# Patient Record
Sex: Male | Born: 1955 | Race: Black or African American | Hispanic: No | Smoking: Former smoker
Health system: Southern US, Community
[De-identification: ages and names within clinical notes are randomized; demographics above are authoritative.]

## PROBLEM LIST (undated history)

## (undated) DIAGNOSIS — I10 Essential (primary) hypertension: Secondary | ICD-10-CM

## (undated) DIAGNOSIS — C801 Malignant (primary) neoplasm, unspecified: Secondary | ICD-10-CM

## (undated) DIAGNOSIS — I480 Paroxysmal atrial fibrillation: Secondary | ICD-10-CM

## (undated) DIAGNOSIS — E119 Type 2 diabetes mellitus without complications: Secondary | ICD-10-CM

## (undated) DIAGNOSIS — E782 Mixed hyperlipidemia: Secondary | ICD-10-CM

## (undated) DIAGNOSIS — D649 Anemia, unspecified: Secondary | ICD-10-CM

## (undated) HISTORY — DX: Anemia, unspecified: D64.9

## (undated) HISTORY — PX: SHOULDER ARTHROSCOPY: SHX128

## (undated) HISTORY — DX: Paroxysmal atrial fibrillation: I48.0

## (undated) HISTORY — DX: Essential (primary) hypertension: I10

## (undated) HISTORY — DX: Malignant (primary) neoplasm, unspecified: C80.1

## (undated) HISTORY — PX: BACK SURGERY: SHX140

## (undated) HISTORY — DX: Mixed hyperlipidemia: E78.2

## (undated) HISTORY — DX: Type 2 diabetes mellitus without complications: E11.9

---

## 1998-08-21 ENCOUNTER — Encounter: Payer: Self-pay | Admitting: Neurosurgery

## 1998-08-21 ENCOUNTER — Inpatient Hospital Stay (HOSPITAL_COMMUNITY): Admission: RE | Admit: 1998-08-21 | Discharge: 1998-08-22 | Payer: Self-pay | Admitting: Neurosurgery

## 2000-01-28 ENCOUNTER — Ambulatory Visit (HOSPITAL_COMMUNITY): Admission: RE | Admit: 2000-01-28 | Discharge: 2000-01-29 | Payer: Self-pay | Admitting: Neurosurgery

## 2000-01-28 ENCOUNTER — Encounter: Payer: Self-pay | Admitting: Neurosurgery

## 2004-02-27 ENCOUNTER — Ambulatory Visit (HOSPITAL_COMMUNITY): Admission: RE | Admit: 2004-02-27 | Discharge: 2004-02-27 | Payer: Self-pay | Admitting: Orthopedic Surgery

## 2004-05-12 ENCOUNTER — Ambulatory Visit: Payer: Self-pay | Admitting: Orthopedic Surgery

## 2006-08-14 ENCOUNTER — Ambulatory Visit (HOSPITAL_COMMUNITY): Admission: RE | Admit: 2006-08-14 | Discharge: 2006-08-14 | Payer: Self-pay | Admitting: Cardiovascular Disease

## 2009-11-20 HISTORY — PX: COLONOSCOPY: SHX174

## 2014-10-21 ENCOUNTER — Encounter (INDEPENDENT_AMBULATORY_CARE_PROVIDER_SITE_OTHER): Payer: Self-pay | Admitting: *Deleted

## 2014-10-21 ENCOUNTER — Encounter (INDEPENDENT_AMBULATORY_CARE_PROVIDER_SITE_OTHER): Payer: Self-pay

## 2014-11-26 HISTORY — PX: COLONOSCOPY: SHX174

## 2015-06-07 HISTORY — PX: COLONOSCOPY: SHX174

## 2016-05-10 ENCOUNTER — Encounter: Payer: Self-pay | Admitting: Orthopedic Surgery

## 2016-05-10 ENCOUNTER — Ambulatory Visit (INDEPENDENT_AMBULATORY_CARE_PROVIDER_SITE_OTHER): Payer: BLUE CROSS/BLUE SHIELD | Admitting: Orthopedic Surgery

## 2016-05-10 ENCOUNTER — Ambulatory Visit (INDEPENDENT_AMBULATORY_CARE_PROVIDER_SITE_OTHER): Payer: BLUE CROSS/BLUE SHIELD

## 2016-05-10 VITALS — BP 123/70 | HR 65 | Wt 177.0 lb

## 2016-05-10 DIAGNOSIS — M75102 Unspecified rotator cuff tear or rupture of left shoulder, not specified as traumatic: Secondary | ICD-10-CM

## 2016-05-10 DIAGNOSIS — G8929 Other chronic pain: Secondary | ICD-10-CM

## 2016-05-10 DIAGNOSIS — M25512 Pain in left shoulder: Secondary | ICD-10-CM

## 2016-05-10 NOTE — Progress Notes (Signed)
Patient ID: Pedro Gibbs, male   DOB: 1955-10-01, 60 y.o.   MRN: [id number]  Chief Complaint  Patient presents with  . Shoulder Pain    LEFT SHOULDER PAIN    HPI Pedro Gibbs is a 60 y.o. male.  Presents with pain in his left shoulder. Pain over the lateral deltoid pain with internal rotation and elevation  Pain now for several weeks no improvement with over-the-counter measures  Some mild dull ache tremor at rest and started when the weather turned  Review of Systems Review of Systems  Past Medical History:  Diagnosis Date  . Cancer ([place])   . Diabetes mellitus without complication ([place])   . Hypertension     Past Surgical History:  Procedure Laterality Date  . SHOULDER ARTHROSCOPY Right    Dr Lemmie Evens     Social History Social History  Substance Use Topics  . Smoking status: Never Smoker  . Smokeless tobacco: Never Used  . Alcohol use Not on file    Allergies  Allergen Reactions  . Asa [Aspirin] Nausea Only    Current Outpatient Prescriptions  Medication Sig Dispense Refill  . fenofibrate 160 MG tablet Take 160 mg by mouth daily.    Marland Kitchen HYDROcodone-acetaminophen (NORCO/VICODIN) 5-325 MG tablet Take 1 tablet by mouth every 6 (six) hours as needed for moderate pain.    Marland Kitchen lisinopril (PRINIVIL,ZESTRIL) 10 MG tablet Take 10 mg by mouth daily.     No current facility-administered medications for this visit.        Physical Exam BP 123/70   Pulse 65   Wt 177 lb (80.3 kg)  Physical Exam  Constitutional: He is oriented to person, place, and time. He appears well-developed and well-nourished. No distress.  Cardiovascular: Normal rate and intact distal pulses.   Neurological: He is alert and oriented to person, place, and time.  Skin: Skin is warm and dry. No rash noted. He is not diaphoretic. No erythema. No pallor.  Psychiatric: He has a normal mood and affect. His behavior is normal. Judgment and thought content normal.   Ambulatory status normal with no assistive  devices Right Shoulder Exam  Right shoulder exam is normal.  Tenderness  The patient is experiencing no tenderness.    Range of Motion  The patient has normal right shoulder ROM. Internal Rotation 0 degrees: normal   Muscle Strength  The patient has normal right shoulder strength.  Tests  Apprehension: negative Impingement: negative  Other  Erythema: absent Sensation: normal Pulse: present   Left Shoulder Exam   Tenderness  The patient is experiencing no tenderness.     Range of Motion  Active Abduction: normal  Passive Abduction: normal  Forward Flexion: normal Left shoulder forward flexion: Pain at 120 through 150  External Rotation: normal  Left shoulder internal rotation 0 degrees: Loss of internal rotation compared to the right.   Muscle Strength  The patient has normal left shoulder strength. External Rotation: 5/5  Supraspinatus: 5/5  Subscapularis: 5/5   Tests  Apprehension: negative Hawkin's test: positive Impingement: positive  Other  Erythema: absent Sensation: normal Pulse: present        Data Reviewed Imaging of the left shoulder are independently reviewed and I interpreted these as mild joint space narrowing type I acromion  Assessment  Left shoulder bursitis impingement   Plan  Left shoulder injection and physical therapy at home  Procedure note the subacromial injection shoulder left   Verbal consent was obtained to inject the  Left  Shoulder  Timeout was completed to confirm the injection site is a subacromial space of the  left  shoulder  Medication used Depo-Medrol 40 mg and lidocaine 1% 3 cc  Anesthesia was provided by ethyl chloride  The injection was performed in the left  posterior subacromial space. After pinning the skin with alcohol and anesthetized the skin with ethyl chloride the subacromial space was injected using a 20-gauge needle. There were no complications  Sterile dressing was  applied.

## 2016-05-10 NOTE — Patient Instructions (Addendum)

## 2016-12-01 ENCOUNTER — Other Ambulatory Visit: Payer: Self-pay | Admitting: Physician Assistant

## 2016-12-10 ENCOUNTER — Other Ambulatory Visit: Payer: Self-pay | Admitting: Physician Assistant

## 2017-04-04 ENCOUNTER — Other Ambulatory Visit: Payer: Self-pay | Admitting: *Deleted

## 2018-04-26 NOTE — Progress Notes (Signed)
NEW PROBLEM OFFICE VISIT  Chief Complaint  Patient presents with  . Shoulder Pain    right greater than left shoulder pain     61 YO MALE works at a [employer] and is S/P RT SHOULDER ACROMIOPLASTY IN 2005 presents for evaluation of bilateral shoulder pain right greater than left.  He complains of stiffness in his right shoulder along the anterior joint line also notes this in the left shoulder right is worse.  Complains of pain at night difficulty raising his right arm not as much trouble on the left.  He tried some topical medications he is also on hydrocodone for his back which does not help his shoulder pain.  Pain is increased over the last month best described as a dull ache in the anterior joint line   Review of Systems  Musculoskeletal: Positive for back pain.  Neurological: Negative for tingling and sensory change.     Past Medical History:  Diagnosis Date  . Cancer ([place])   . Diabetes mellitus without complication ([place])   . Hypertension     Past Surgical History:  Procedure Laterality Date  . SHOULDER ARTHROSCOPY Right    Dr Lemmie Evens     History reviewed. No pertinent family history. Social History   Tobacco Use  . Smoking status: Never Smoker  . Smokeless tobacco: Never Used  Substance Use Topics  . Alcohol use: Not on file  . Drug use: Not on file    Allergies  Allergen Reactions  . Asa [Aspirin] Nausea Only    Current Meds  Medication Sig  . fenofibrate 160 MG tablet Take 160 mg by mouth daily.  Marland Kitchen HYDROcodone-acetaminophen (NORCO/VICODIN) 5-325 MG tablet Take 1 tablet by mouth every 6 (six) hours as needed for moderate pain.  Marland Kitchen lisinopril (PRINIVIL,ZESTRIL) 10 MG tablet Take 10 mg by mouth daily.    BP (!) 155/80   Pulse 82   Ht 6' (1.829 m)   Wt 166 lb (75.3 kg)   BMI 22.51 kg/m   Physical Exam  Constitutional: He is oriented to person, place, and time. He appears well-developed and well-nourished.  Neurological: He is alert and oriented to  person, place, and time. Gait normal.  Psychiatric: He has a normal mood and affect. His behavior is normal. Judgment and thought content normal.    Ortho Exam Right shoulder tender over the anterior joint line with 30 degrees of external rotation with his arm at his side 90 degrees of active abduction and flexion with 120 degrees of passive flexion with pain at terminal range of motion, posterior anterior stability inferior stability confirmed strength normal skin normal pulse and temperature normal sensation normal  Left shoulder active and passive flexion are 150 degrees with pain at terminal motion with external rotation 40 degrees stable in all planes strength normal skin normal pulse and temperature normal sensation normal   MEDICAL DECISION SECTION  Xrays were done at [facility]   SEE DICTATED REPORT: Moderate arthritis left and right shoulder with moderate amounts of glenohumeral joint space narrowing without osteophyte formation greater tuberosity sclerosis  Encounter Diagnosis  Name Primary?  . Chronic pain of both shoulders Yes    PLAN: (Rx., injectx, surgery, frx, mri/ct) Reviewed diagnosis of bilateral shoulder arthritis Recommended bilateral subacromial injections, may be a candidate for intra-articular injection if no relief from the subacromial injection Start NSAID diclofenac twice a day Advised may need shoulder replacements in the future.   Procedure note the subacromial injection shoulder RIGHT  Verbal consent  was obtained to inject the  RIGHT   Shoulder  Timeout was completed to confirm the injection site is a subacromial space of the  RIGHT  shoulder   Medication used Depo-Medrol 40 mg and lidocaine 1% 3 cc  Anesthesia was provided by ethyl chloride  The injection was performed in the RIGHT  posterior subacromial space. After pinning the skin with alcohol and anesthetized the skin with ethyl chloride the subacromial space was injected using a 20-gauge needle.  There were no complications  Sterile dressing was applied.    Procedure note the subacromial injection shoulder left   Verbal consent was obtained to inject the  Left   Shoulder  Timeout was completed to confirm the injection site is a subacromial space of the  left  shoulder  Medication used Depo-Medrol 40 mg and lidocaine 1% 3 cc  Anesthesia was provided by ethyl chloride  The injection was performed in the left  posterior subacromial space. After pinning the skin with alcohol and anesthetized the skin with ethyl chloride the subacromial space was injected using a 20-gauge needle. There were no complications  Sterile dressing was applied.           No orders of the defined types were placed in this encounter.   Arther Abbott, MD  04/27/2018 9:19 AM

## 2018-04-27 ENCOUNTER — Encounter

## 2018-04-27 ENCOUNTER — Ambulatory Visit (INDEPENDENT_AMBULATORY_CARE_PROVIDER_SITE_OTHER): Payer: BLUE CROSS/BLUE SHIELD

## 2018-04-27 ENCOUNTER — Encounter: Payer: Self-pay | Admitting: Orthopedic Surgery

## 2018-04-27 ENCOUNTER — Ambulatory Visit: Payer: BLUE CROSS/BLUE SHIELD | Admitting: Orthopedic Surgery

## 2018-04-27 VITALS — BP 155/80 | HR 82 | Ht 72.0 in | Wt 166.0 lb

## 2018-04-27 DIAGNOSIS — M25512 Pain in left shoulder: Secondary | ICD-10-CM | POA: Diagnosis not present

## 2018-04-27 DIAGNOSIS — M25511 Pain in right shoulder: Secondary | ICD-10-CM

## 2018-04-27 DIAGNOSIS — M19011 Primary osteoarthritis, right shoulder: Secondary | ICD-10-CM | POA: Diagnosis not present

## 2018-04-27 DIAGNOSIS — M19012 Primary osteoarthritis, left shoulder: Secondary | ICD-10-CM

## 2018-04-27 DIAGNOSIS — G8929 Other chronic pain: Secondary | ICD-10-CM

## 2018-04-27 MED ORDER — DICLOFENAC SODIUM 75 MG PO TBEC: 75.0000 mg | DELAYED_RELEASE_TABLET | Freq: Two times a day (BID) | ORAL | 2 refills | Status: DC

## 2018-05-16 ENCOUNTER — Ambulatory Visit: Payer: BLUE CROSS/BLUE SHIELD | Admitting: Orthopedic Surgery

## 2018-05-16 ENCOUNTER — Encounter: Payer: Self-pay | Admitting: Orthopedic Surgery

## 2018-05-16 VITALS — BP 149/75 | HR 86 | Ht 72.0 in | Wt 165.0 lb

## 2018-05-16 DIAGNOSIS — G8929 Other chronic pain: Secondary | ICD-10-CM

## 2018-05-16 DIAGNOSIS — M19012 Primary osteoarthritis, left shoulder: Secondary | ICD-10-CM

## 2018-05-16 DIAGNOSIS — M25512 Pain in left shoulder: Secondary | ICD-10-CM

## 2018-05-16 DIAGNOSIS — M25511 Pain in right shoulder: Secondary | ICD-10-CM | POA: Diagnosis not present

## 2018-05-16 DIAGNOSIS — M19011 Primary osteoarthritis, right shoulder: Secondary | ICD-10-CM

## 2018-05-16 NOTE — Progress Notes (Signed)
Chief Complaint  Patient presents with  . Shoulder Pain    bilateral     62 year old male had a right shoulder acromioplasty in 2005 for impingement syndrome presented on November 22 with bilateral shoulder pain right greater than left this included stiffness in the right shoulder along the anterior joint line pain at night and difficulty raising his arm on both sides.  His pain was not relieved with topical medication hydrocodone and anti-inflammatory medication which included diclofenac  He got 2 injections but he did not improve and he presents back still complaining of pain and inability to sleep at night  Review of systems history of back pain  Reexamination confirms that he does have tenderness over the anterior joint line limited external rotation with his arm at his side 90 degrees of abduction 120 degrees of passive flexion pain at terminal range of motion without instability his strength seem normal  His left shoulder has much better range of motion with pain at terminal flexion 150 degrees external rotation 40 degrees normal strength  Both sides exhibit normal pulse perfusion no skin abnormality surgical sites on the right noted without complication  X-rays show arthritis of the left and right shoulder with glenohumeral joint space narrowing  Recommend MRI both shoulders.

## 2018-05-16 NOTE — Addendum Note (Signed)
Addended byCandice Camp on: 05/16/2018 02:52 PM   Modules accepted: Orders

## 2018-05-17 ENCOUNTER — Telehealth: Payer: Self-pay | Admitting: Radiology

## 2018-05-17 NOTE — Telephone Encounter (Signed)
I called patient about MRI scan, I have scheduled but the day is not good for him, I gave him the number to reschedule

## 2018-05-24 ENCOUNTER — Ambulatory Visit (HOSPITAL_COMMUNITY): Payer: BLUE CROSS/BLUE SHIELD

## 2018-05-24 ENCOUNTER — Other Ambulatory Visit (HOSPITAL_COMMUNITY): Payer: BLUE CROSS/BLUE SHIELD

## 2018-05-25 ENCOUNTER — Ambulatory Visit (HOSPITAL_COMMUNITY)
Admission: RE | Admit: 2018-05-25 | Discharge: 2018-05-25 | Disposition: A | Discharge status: Home / Self Care | Payer: BLUE CROSS/BLUE SHIELD | Source: Ambulatory Visit | Attending: Orthopedic Surgery | Admitting: Orthopedic Surgery

## 2018-05-25 DIAGNOSIS — G8929 Other chronic pain: Secondary | ICD-10-CM

## 2018-05-25 DIAGNOSIS — M25511 Pain in right shoulder: Secondary | ICD-10-CM | POA: Insufficient documentation

## 2018-05-25 DIAGNOSIS — M19012 Primary osteoarthritis, left shoulder: Secondary | ICD-10-CM | POA: Diagnosis not present

## 2018-05-25 DIAGNOSIS — M19011 Primary osteoarthritis, right shoulder: Secondary | ICD-10-CM | POA: Insufficient documentation

## 2018-05-25 DIAGNOSIS — M779 Enthesopathy, unspecified: Secondary | ICD-10-CM | POA: Insufficient documentation

## 2018-05-25 DIAGNOSIS — M25512 Pain in left shoulder: Secondary | ICD-10-CM | POA: Diagnosis not present

## 2018-07-02 ENCOUNTER — Ambulatory Visit: Payer: BLUE CROSS/BLUE SHIELD | Admitting: Orthopedic Surgery

## 2018-07-02 VITALS — BP 130/75 | HR 75 | Ht 72.0 in | Wt 165.0 lb

## 2018-07-02 DIAGNOSIS — M19011 Primary osteoarthritis, right shoulder: Secondary | ICD-10-CM

## 2018-07-02 DIAGNOSIS — M19012 Primary osteoarthritis, left shoulder: Secondary | ICD-10-CM | POA: Diagnosis not present

## 2018-07-02 DIAGNOSIS — M75101 Unspecified rotator cuff tear or rupture of right shoulder, not specified as traumatic: Secondary | ICD-10-CM | POA: Diagnosis not present

## 2018-07-02 NOTE — Progress Notes (Signed)
Chief Complaint  Patient presents with  . Shoulder Pain    Recheck on bilateral shoulders, Review MRI results.    The right shoulder is hurting worse than the left he has good range of motion the left shoulder with minimal pain  The right shoulder is much more symptomatic hurts him at night he has trouble lifting it and getting behind his back  Review of Systems  Musculoskeletal: Negative for neck pain.  Neurological: Negative for tingling, tremors and sensory change.   BP 130/75   Pulse 75   Ht 6' (1.829 m)   Wt 165 lb (74.8 kg)   BMI 22.38 kg/m  Right shoulder painful range of motion in all planes especially with decreased internal rotation noted he has full forward elevation but pain no instability cuff is intact skin is normal pulses are good sensation is intact  His left shoulder has much better range of motion without discomfort negative impingement sign good strength normal skin neurovascular exam is intact  IMPRESSION: mri right shoulder  Rotator cuff tendinopathy without tear. Mild appearing tendinopathy of the intra-articular segment of the long head of biceps also noted.   Moderate acromioclavicular osteoarthritis. Small appearing subacromial spur also noted.     Electronically Signed   By: Inge Rise M.D.   On: 05/25/2018 11:27   MRI OF THE LEFT SHOULDER WITHOUT CONTRAST   TECHNIQUE: Multiplanar, multisequence MR imaging of the shoulder was performed. No intravenous contrast was administered.   COMPARISON:  Plain films left shoulder 04/27/2018.   FINDINGS: Rotator cuff: Mild, heterogeneously increased T2 signal in the rotator cuff tendons consistent with tendinopathy is identified. No tear.   Muscles:  Normal without atrophy or focal lesion.   Biceps long head: Intact. Mild intrasubstance increased T2 signal in the intra-articular segment is noted.   Acromioclavicular Joint: Moderate degenerative change is seen. Type 2 acromion. There is some  subacromial spurring. No evidence of bursitis.   Glenohumeral Joint: Appears normal.   Labrum:  Intact.   Bones:  No fracture or worrisome lesion.   Other: None.   IMPRESSION: Rotator cuff tendinopathy without tear. Mild appearing tendinopathy of the intra-articular long head of biceps also noted.   Moderate acromioclavicular osteoarthritis. There is some subacromial spurring.     Electronically Signed   By: Inge Rise M.D.   On: 05/25/2018 11:24   Encounter Diagnoses  Name Primary?  . Bilateral shoulder region arthritis Yes  . Rotator cuff syndrome of right shoulder    I Reviewed the right shoulder MRI he has some glenohumeral arthritis more notable on x-ray some AC joint arthritis and rotator cuff tendinitis without tear  On the left side we see again tendinopathy tendinitis no tear AC joint arthritis.  Discussed with him that we should do the therapy we have had the injections and the medication is been on diclofenac and then if he does not improve then we can do arthroscopic subacromial decompression distal clavicle excision and joint debridement for the right shoulder  He is agreeable he will have therapy at [facility] and come back and see [place] in 8 weeks

## 2018-07-02 NOTE — Patient Instructions (Addendum)
[facility] physical therapy will call you with appointment Pedro Gibbs

## 2018-08-31 ENCOUNTER — Ambulatory Visit: Payer: BLUE CROSS/BLUE SHIELD | Admitting: Orthopedic Surgery

## 2018-08-31 ENCOUNTER — Telehealth: Payer: Self-pay | Admitting: Orthopedic Surgery

## 2018-08-31 NOTE — Telephone Encounter (Signed)
Phone note for Mr. Pedro Gibbs who has a left shoulder problem  He was sent for physical therapy after injection and anti-inflammatories.  He was scheduled for an office visit today which we elected to cancel secondary to COVID-19  He says his shoulder is much better his activities of daily living and grooming has improved he is not taking any medication for pain  His his situation deteriorates he will call us for an appointment

## 2019-04-25 ENCOUNTER — Emergency Department (HOSPITAL_COMMUNITY)
Admission: EM | Admit: 2019-04-25 | Discharge: 2019-04-25 | Disposition: A | Discharge status: Home / Self Care | Payer: BC Managed Care – PPO | Attending: Emergency Medicine | Admitting: Emergency Medicine

## 2019-04-25 ENCOUNTER — Encounter (HOSPITAL_COMMUNITY): Payer: Self-pay

## 2019-04-25 ENCOUNTER — Other Ambulatory Visit: Payer: Self-pay

## 2019-04-25 DIAGNOSIS — R42 Dizziness and giddiness: Secondary | ICD-10-CM | POA: Insufficient documentation

## 2019-04-25 DIAGNOSIS — Z79899 Other long term (current) drug therapy: Secondary | ICD-10-CM | POA: Insufficient documentation

## 2019-04-25 DIAGNOSIS — E119 Type 2 diabetes mellitus without complications: Secondary | ICD-10-CM | POA: Insufficient documentation

## 2019-04-25 DIAGNOSIS — I10 Essential (primary) hypertension: Secondary | ICD-10-CM | POA: Diagnosis not present

## 2019-04-25 LAB — COMPREHENSIVE METABOLIC PANEL
ALT: 18 U/L (ref 0–44)
AST: 22 U/L (ref 15–41)
Albumin: 3.8 g/dL (ref 3.5–5.0)
Alkaline Phosphatase: 48 U/L (ref 38–126)
Anion gap: 8 (ref 5–15)
BUN: 14 mg/dL (ref 8–23)
CO2: 24 mmol/L (ref 22–32)
Calcium: 9.1 mg/dL (ref 8.9–10.3)
Chloride: 106 mmol/L (ref 98–111)
Creatinine, Ser: 0.84 mg/dL (ref 0.61–1.24)
GFR calc Af Amer: >60 mL/min (ref >60)
GFR calc non Af Amer: >60 mL/min (ref >60)
Glucose, Bld: 94 mg/dL (ref 70–99)
Potassium: 4.5 mmol/L (ref 3.5–5.1)
Sodium: 138 mmol/L (ref 135–145)
Total Bilirubin: 0.6 mg/dL (ref 0.3–1.2)
Total Protein: 7.2 g/dL (ref 6.5–8.1)

## 2019-04-25 LAB — CBC WITH DIFFERENTIAL/PLATELET
Abs Immature Granulocytes: 0.01 10*3/uL (ref 0.00–0.07)
Basophils Absolute: 0 10*3/uL (ref 0.0–0.1)
Basophils Relative: 0 %
Eosinophils Absolute: 0.1 10*3/uL (ref 0.0–0.5)
Eosinophils Relative: 1 %
HCT: 37.7 % — ABNORMAL LOW (ref 39.0–52.0)
Hemoglobin: 12.2 g/dL — ABNORMAL LOW (ref 13.0–17.0)
Immature Granulocytes: 0 %
Lymphocytes Relative: 21 %
Lymphs Abs: 1.2 10*3/uL (ref 0.7–4.0)
MCH: 29.9 pg (ref 26.0–34.0)
MCHC: 32.4 g/dL (ref 30.0–36.0)
MCV: 92.4 fL (ref 80.0–100.0)
Monocytes Absolute: 0.5 10*3/uL (ref 0.1–1.0)
Monocytes Relative: 8 %
Neutro Abs: 3.9 10*3/uL (ref 1.7–7.7)
Neutrophils Relative %: 70 %
Platelets: 274 10*3/uL (ref 150–400)
RBC: 4.08 MIL/uL — ABNORMAL LOW (ref 4.22–5.81)
RDW: 13.7 % (ref 11.5–15.5)
WBC: 5.6 10*3/uL (ref 4.0–10.5)
nRBC: 0 % (ref 0.0–0.2)

## 2019-04-25 LAB — CBG MONITORING, ED: Glucose-Capillary: 85 mg/dL (ref 70–99)

## 2019-04-25 NOTE — ED Triage Notes (Signed)
Pt was on his break at work and ate his lunch and took his lisinopril 20 mg. When he got back on his job he became dizzy and lightheaded . Pt went to nurse office and systolic was [phone]. EMS called

## 2019-04-25 NOTE — ED Provider Notes (Signed)
[facility] EMERGENCY DEPARTMENT Provider Note   CSN: [id number] Arrival date & time: 04/25/19  1158     History   Chief Complaint Chief Complaint  Patient presents with  . Dizziness  . Hypertension    HPI Pedro Gibbs is a 63 y.o. male.     Patient complains of dizziness earlier today.  But he took his blood pressure medicine and felt better.  Patient is back to normal now  The history is provided by the patient. No language interpreter was used.  Dizziness Quality:  Lightheadedness Severity:  Mild Onset quality:  Sudden Timing:  Intermittent Progression:  Resolved Chronicity:  New Context: bending over   Relieved by:  Nothing Worsened by:  Nothing Ineffective treatments:  None tried Associated symptoms: no blood in stool, no chest pain, no diarrhea and no headaches   Hypertension Pertinent negatives include no chest pain, no abdominal pain and no headaches.    Past Medical History:  Diagnosis Date  . Cancer ([place])   . Diabetes mellitus without complication ([place])   . Hypertension     There are no active problems to display for this patient.   Past Surgical History:  Procedure Laterality Date  . SHOULDER ARTHROSCOPY Right    Dr Lemmie Evens         Home Medications    Prior to Admission medications   Medication Sig Start Date End Date Taking? Authorizing Provider  allopurinol (ZYLOPRIM) 300 MG tablet Take 300 mg by mouth daily as needed.  02/07/19  Yes [provider]  indomethacin (INDOCIN) 50 MG capsule Take 1 capsule by mouth 2 (two) times daily as needed. 02/07/19  Yes [provider]  lisinopril (ZESTRIL) 20 MG tablet Take 20 mg by mouth daily. 04/12/19  Yes [provider]    Family History Family History  Problem Relation Age of Onset  . Diabetes Mother   . Healthy Father     Social History Social History   Tobacco Use  . Smoking status: Never Smoker  . Smokeless tobacco: Never Used  Substance Use Topics  . Alcohol  use: Never    Frequency: Never  . Drug use: Never     Allergies   Asa [aspirin]   Review of Systems Review of Systems  Constitutional: Negative for appetite change and fatigue.  HENT: Negative for congestion, ear discharge and sinus pressure.   Eyes: Negative for discharge.  Respiratory: Negative for cough.   Cardiovascular: Negative for chest pain.  Gastrointestinal: Negative for abdominal pain, blood in stool and diarrhea.  Genitourinary: Negative for frequency and hematuria.  Musculoskeletal: Negative for back pain.  Skin: Negative for rash.  Neurological: Positive for dizziness. Negative for seizures and headaches.  Psychiatric/Behavioral: Negative for hallucinations.     Physical Exam Updated Vital Signs BP (!) 141/83   Pulse 69   Temp 98.1 F (36.7 C) (Oral)   Resp 15   Ht 6' (1.829 m)   Wt 77.1 kg   SpO2 98%   BMI 23.06 kg/m   Physical Exam Vitals signs and nursing note reviewed.  Constitutional:      Appearance: He is well-developed.  HENT:     Head: Normocephalic.     Nose: Nose normal.  Eyes:     General: No scleral icterus.    Conjunctiva/sclera: Conjunctivae normal.  Neck:     Musculoskeletal: Neck supple.     Thyroid: No thyromegaly.  Cardiovascular:     Rate and Rhythm: Normal rate and regular rhythm.  Heart sounds: No murmur. No friction rub. No gallop.   Pulmonary:     Breath sounds: No stridor. No wheezing or rales.  Chest:     Chest wall: No tenderness.  Abdominal:     General: There is no distension.     Tenderness: There is no abdominal tenderness. There is no rebound.  Musculoskeletal: Normal range of motion.  Lymphadenopathy:     Cervical: No cervical adenopathy.  Skin:    Findings: No erythema or rash.  Neurological:     Mental Status: He is oriented to person, place, and time.     Motor: No abnormal muscle tone.     Coordination: Coordination normal.  Psychiatric:        Behavior: Behavior normal.      ED  Treatments / Results  Labs (all labs ordered are listed, but only abnormal results are displayed) Labs Reviewed  CBC WITH DIFFERENTIAL/PLATELET - Abnormal; Notable for the following components:      Result Value   RBC 4.08 (*)    Hemoglobin 12.2 (*)    HCT 37.7 (*)    All other components within normal limits  COMPREHENSIVE METABOLIC PANEL  CBG MONITORING, ED    EKG None  Radiology No results found.  Procedures Procedures (including critical care time)  Medications Ordered in ED Medications - No data to display   Initial Impression / Assessment and Plan / ED Course  I have reviewed the triage vital signs and the nursing notes.  Pertinent labs & imaging results that were available during my care of the patient were reviewed by me and considered in my medical decision making (see chart for details).        Follow-up with your doctor if any problems.  Patient's dizziness improved and his blood pressure improved after he took his blood pressure medicine labs unremarkable.  He will follow-up with PCP  Final Clinical Impressions(s) / ED Diagnoses   Final diagnoses:  Dizziness    ED Discharge Orders    None       Milton Ferguson, MD 04/25/19 1452

## 2019-04-25 NOTE — Discharge Instructions (Addendum)
Followup with your doctor as needed

## 2019-08-02 ENCOUNTER — Ambulatory Visit: Payer: Medicare HMO | Attending: Internal Medicine

## 2019-08-02 DIAGNOSIS — Z23 Encounter for immunization: Secondary | ICD-10-CM

## 2019-08-02 NOTE — Progress Notes (Signed)
   Covid-19 Vaccination Clinic  Name:  DRAYLEN RUTKOWSKI    MRN: [id number] DOB: 08-16-1955  08/02/2019  Mr. Colvard was observed post Covid-19 immunization for 15 minutes without incidence. He was provided with Vaccine Information Sheet and instruction to access the V-Safe system.   Mr. Alvizo was instructed to call 911 with any severe reactions post vaccine: Marland Kitchen Difficulty breathing  . Swelling of your face and throat  . A fast heartbeat  . A bad rash all over your body  . Dizziness and weakness    Immunizations Administered    Name Date Dose VIS Date Route   Pfizer COVID-19 Vaccine 08/02/2019  8:22 AM 0.3 mL 05/17/2019 Intramuscular   Manufacturer: [place]   Lot: [id number]   [place]: [id number]

## 2019-08-27 ENCOUNTER — Ambulatory Visit: Payer: Medicare HMO | Attending: Internal Medicine

## 2019-08-27 DIAGNOSIS — Z23 Encounter for immunization: Secondary | ICD-10-CM

## 2019-08-27 NOTE — Progress Notes (Signed)
   Covid-19 Vaccination Clinic  Name:  Pedro Gibbs    MRN: [id number] DOB: 09-18-1955  08/27/2019  Pedro Gibbs was observed post Covid-19 immunization for 15 minutes without incident. He was provided with Vaccine Information Sheet and instruction to access the V-Safe system.   Pedro Gibbs was instructed to call 911 with any severe reactions post vaccine: Marland Kitchen Difficulty breathing  . Swelling of face and throat  . A fast heartbeat  . A bad rash all over body  . Dizziness and weakness   Immunizations Administered    Name Date Dose VIS Date Route   Pfizer COVID-19 Vaccine 08/27/2019  9:03 AM 0.3 mL 05/17/2019 Intramuscular   Manufacturer: [place]   Lot: [id number]   [place]: [id number]

## 2020-03-11 IMAGING — MR MR SHOULDER*L* W/O CM
4 of 5 series · 19 of 40 positions shown · non-contrast
Comparison: Plain films left shoulder 04/27/2018.

CLINICAL DATA: Chronic bilateral shoulder pain.

EXAM:
MRI OF THE LEFT SHOULDER WITHOUT CONTRAST
TECHNIQUE: Multiplanar, multisequence MR imaging of the shoulder was performed.
No intravenous contrast was administered.

[Series 4: pdfs axial · axial · 4.0mm · 0.26mm/px · z∈[-48,+37]mm · 4 of 22 slices shown]
[im 1/22]
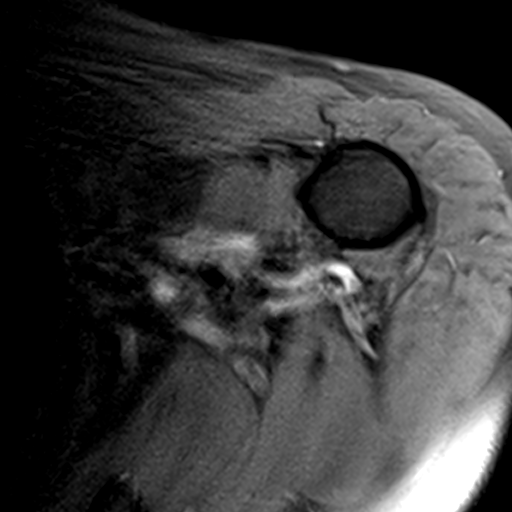
[im 3/22]
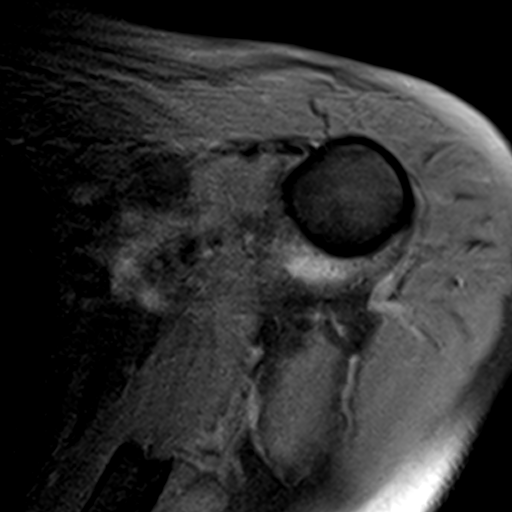
[im 11/22]
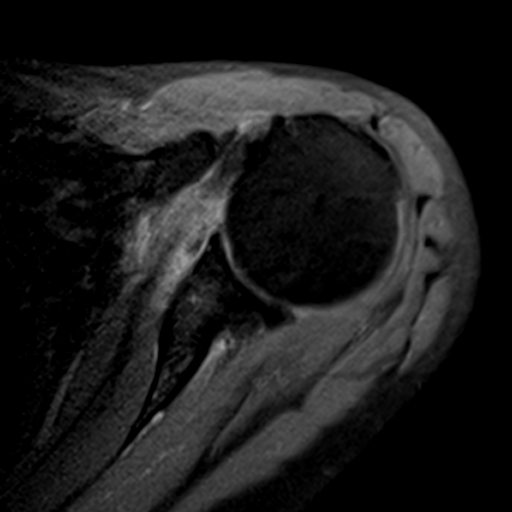
[im 19/22]
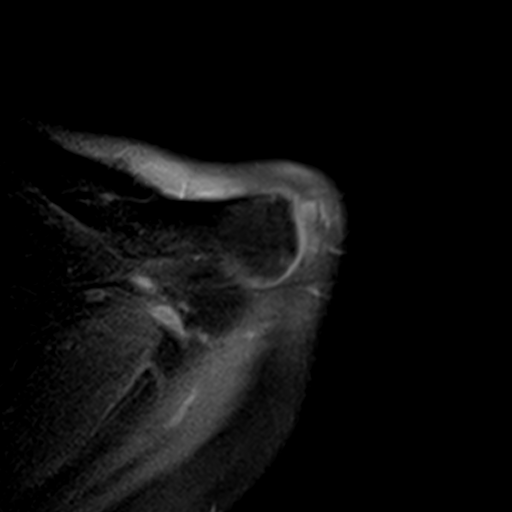

[Series 5: T1 · oblique · 4.0mm · 0.25mm/px · 9 of 22 slices shown]
[im 1/22]
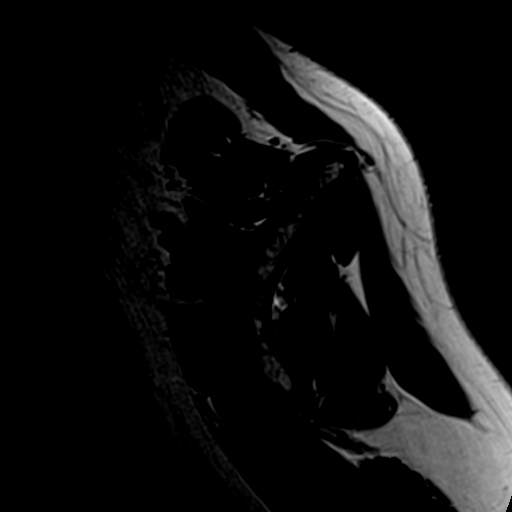
[im 3/22]
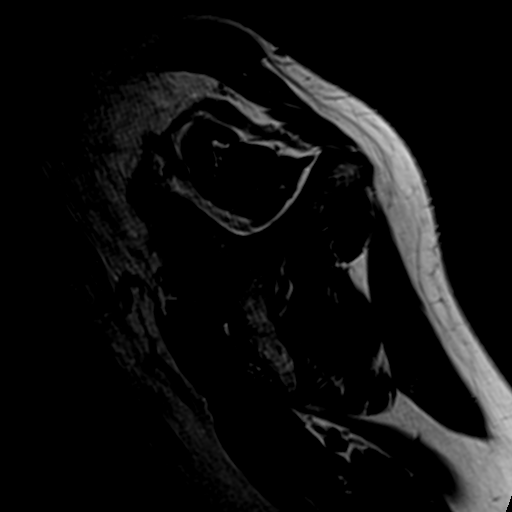
[im 6/22]
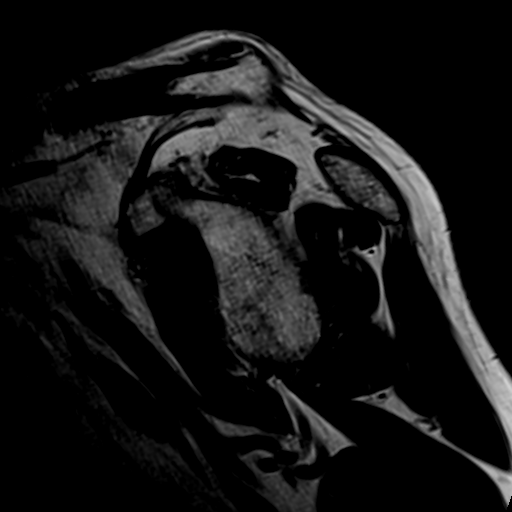
[im 8/22]
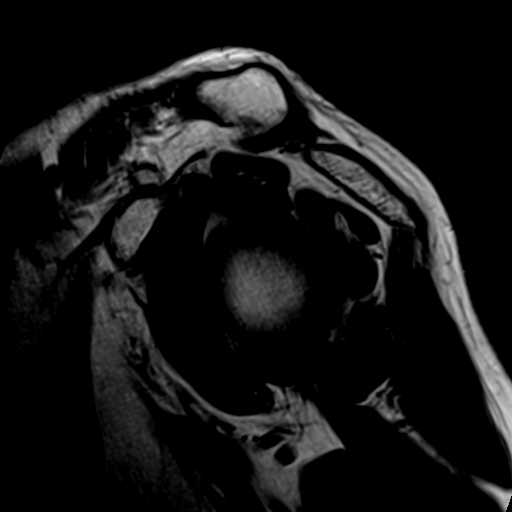
[im 11/22]
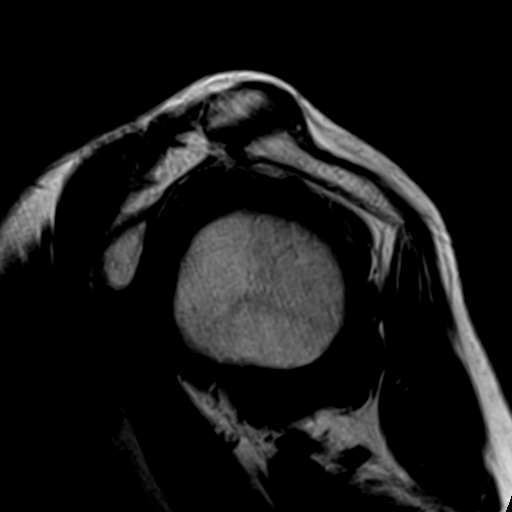
[im 14/22]
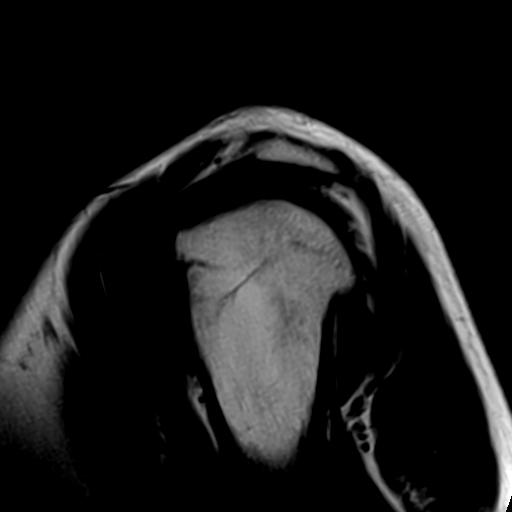
[im 16/22]
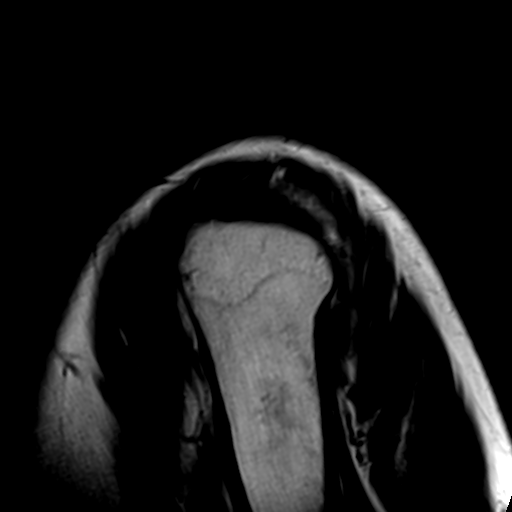
[im 19/22]
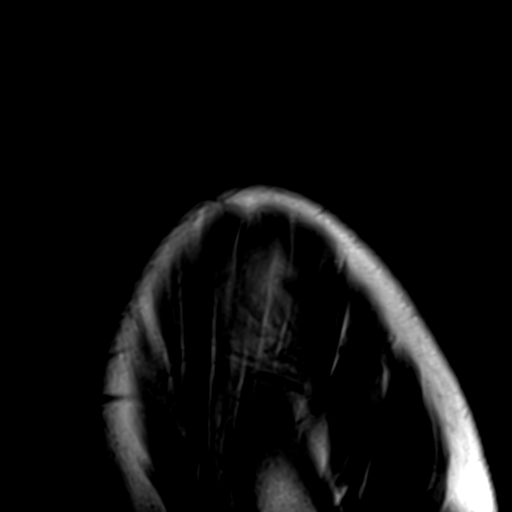
[im 22/22]
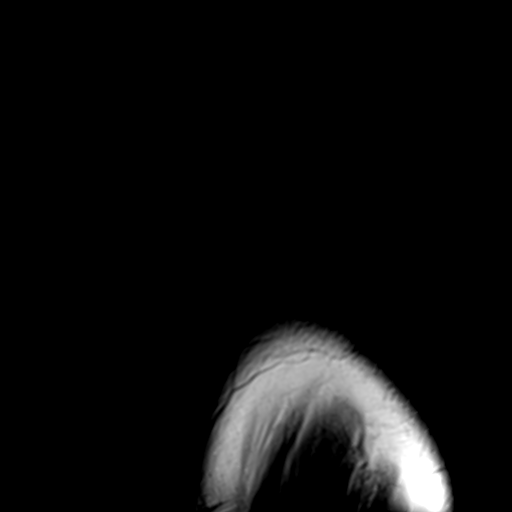

[Series 6: t2fs sagital · oblique · 4.0mm · 0.26mm/px · 3 of 22 slices shown]
[im 4/22]
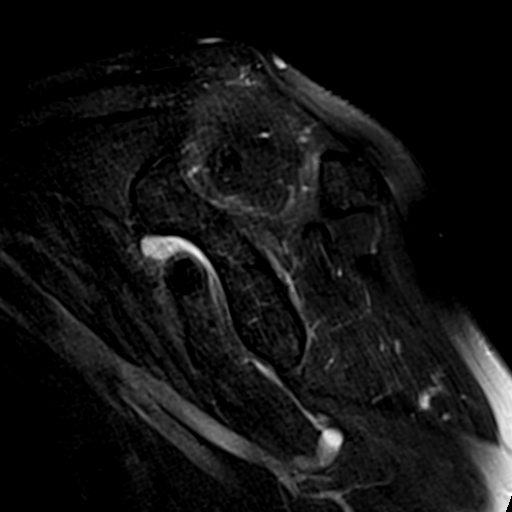
[im 13/22]
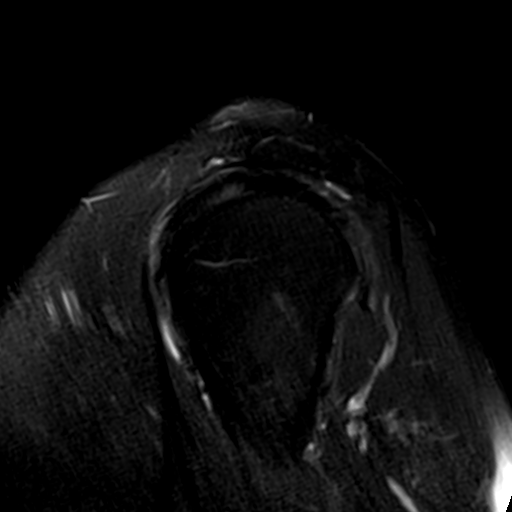
[im 19/22]
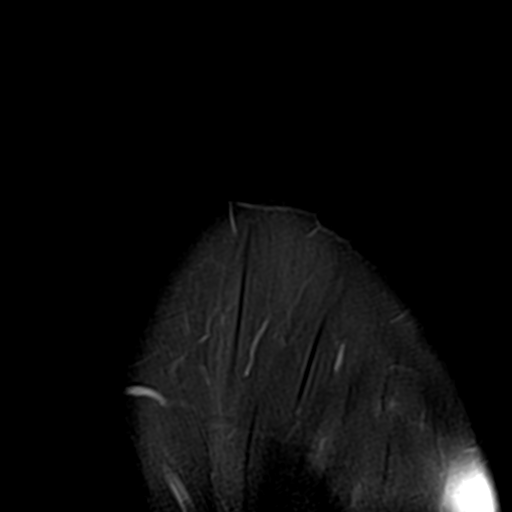

[Series 100: t2fs cor · oblique · 4.0mm · 0.27mm/px · 3 of 19 slices shown]
[im 4/19]
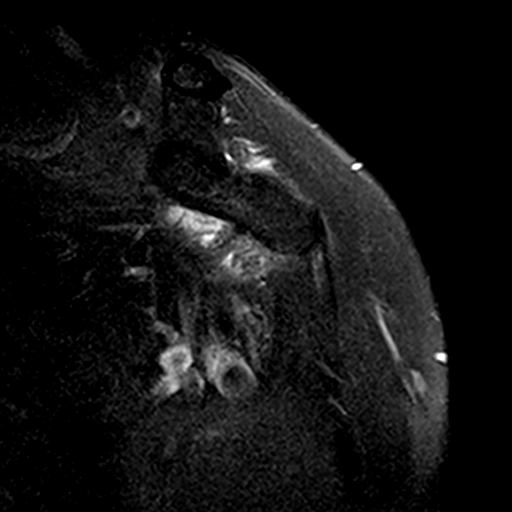
[im 10/19]
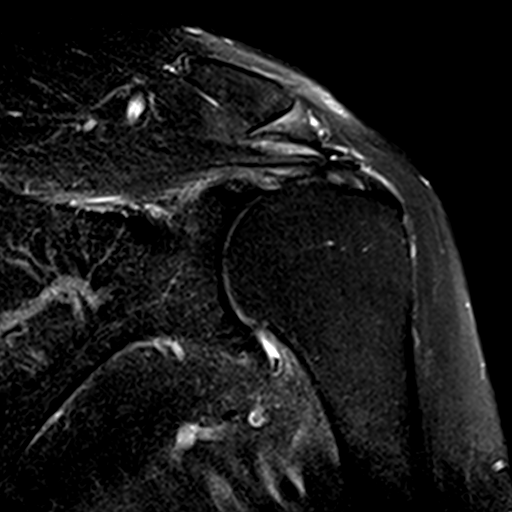
[im 16/19]
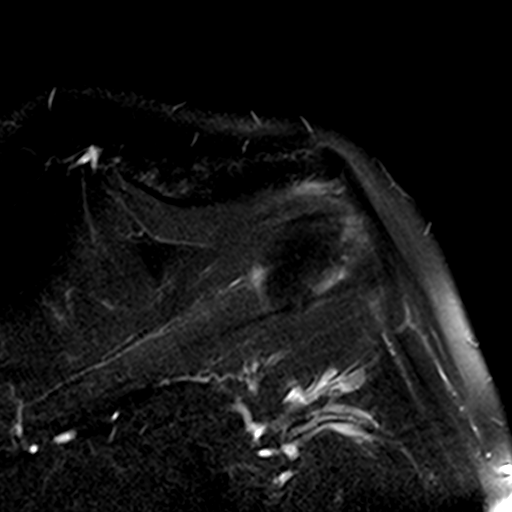

[19 of 40 positions shown; findings below may reference images not displayed]

FINDINGS: Rotator cuff: Mild, heterogeneously increased T2 signal in the
rotator cuff tendons consistent with tendinopathy is identified. No
tear.

Muscles:  Normal without atrophy or focal lesion.

Biceps long head: Intact. Mild intrasubstance increased T2 signal in
the intra-articular segment is noted.

Acromioclavicular Joint: Moderate degenerative change is seen. Type
2 acromion. There is some subacromial spurring. No evidence of
bursitis.

Glenohumeral Joint: Appears normal.

Labrum:  Intact.

Bones:  No fracture or worrisome lesion.

Other: None.
IMPRESSION: Rotator cuff tendinopathy without tear. Mild appearing tendinopathy
of the intra-articular long head of biceps also noted.

Moderate acromioclavicular osteoarthritis. There is some subacromial
spurring.

## 2020-07-25 ENCOUNTER — Other Ambulatory Visit: Payer: Self-pay

## 2020-07-25 ENCOUNTER — Emergency Department (HOSPITAL_COMMUNITY)
Admission: EM | Admit: 2020-07-25 | Discharge: 2020-07-25 | Disposition: A | Discharge status: Home / Self Care | Payer: Medicare HMO | Attending: Emergency Medicine | Admitting: Emergency Medicine

## 2020-07-25 ENCOUNTER — Encounter (HOSPITAL_COMMUNITY): Payer: Self-pay | Admitting: Emergency Medicine

## 2020-07-25 DIAGNOSIS — I10 Essential (primary) hypertension: Secondary | ICD-10-CM | POA: Diagnosis not present

## 2020-07-25 DIAGNOSIS — R002 Palpitations: Secondary | ICD-10-CM | POA: Diagnosis present

## 2020-07-25 DIAGNOSIS — E119 Type 2 diabetes mellitus without complications: Secondary | ICD-10-CM | POA: Diagnosis not present

## 2020-07-25 DIAGNOSIS — Z79899 Other long term (current) drug therapy: Secondary | ICD-10-CM | POA: Insufficient documentation

## 2020-07-25 DIAGNOSIS — Z859 Personal history of malignant neoplasm, unspecified: Secondary | ICD-10-CM | POA: Insufficient documentation

## 2020-07-25 DIAGNOSIS — I480 Paroxysmal atrial fibrillation: Secondary | ICD-10-CM | POA: Insufficient documentation

## 2020-07-25 LAB — BASIC METABOLIC PANEL
Anion gap: 8 (ref 5–15)
BUN: 13 mg/dL (ref 8–23)
CO2: 21 mmol/L — ABNORMAL LOW (ref 22–32)
Calcium: 9.6 mg/dL (ref 8.9–10.3)
Chloride: 110 mmol/L (ref 98–111)
Creatinine, Ser: 0.81 mg/dL (ref 0.61–1.24)
GFR, Estimated: >60 mL/min (ref >60)
Glucose, Bld: 106 mg/dL — ABNORMAL HIGH (ref 70–99)
Potassium: 3.6 mmol/L (ref 3.5–5.1)
Sodium: 139 mmol/L (ref 135–145)

## 2020-07-25 LAB — CBC WITH DIFFERENTIAL/PLATELET
Abs Immature Granulocytes: 0.02 10*3/uL (ref 0.00–0.07)
Basophils Absolute: 0 10*3/uL (ref 0.0–0.1)
Basophils Relative: 0 %
Eosinophils Absolute: 0.1 10*3/uL (ref 0.0–0.5)
Eosinophils Relative: 1 %
HCT: 40.3 % (ref 39.0–52.0)
Hemoglobin: 13.8 g/dL (ref 13.0–17.0)
Immature Granulocytes: 0 %
Lymphocytes Relative: 28 %
Lymphs Abs: 2.1 10*3/uL (ref 0.7–4.0)
MCH: 31.1 pg (ref 26.0–34.0)
MCHC: 34.2 g/dL (ref 30.0–36.0)
MCV: 90.8 fL (ref 80.0–100.0)
Monocytes Absolute: 0.7 10*3/uL (ref 0.1–1.0)
Monocytes Relative: 9 %
Neutro Abs: 4.5 10*3/uL (ref 1.7–7.7)
Neutrophils Relative %: 62 %
Platelets: 235 10*3/uL (ref 150–400)
RBC: 4.44 MIL/uL (ref 4.22–5.81)
RDW: 14.5 % (ref 11.5–15.5)
WBC: 7.4 10*3/uL (ref 4.0–10.5)
nRBC: 0 % (ref 0.0–0.2)

## 2020-07-25 LAB — MAGNESIUM: Magnesium: 1.8 mg/dL (ref 1.7–2.4)

## 2020-07-25 MED ORDER — METOPROLOL SUCCINATE ER 25 MG PO TB24: 50.0000 mg | ORAL_TABLET | Freq: Two times a day (BID) | ORAL | 1 refills | Status: DC

## 2020-07-25 MED ORDER — METOPROLOL TARTRATE 5 MG/5ML IV SOLN
5.0000 mg | Freq: Once | INTRAVENOUS | Status: AC
  Administered 2020-07-25: 5 mg via INTRAVENOUS
  Filled 2020-07-25: qty 5

## 2020-07-25 MED ORDER — APIXABAN 5 MG PO TABS
5.0000 mg | ORAL_TABLET | Freq: Two times a day (BID) | ORAL | Status: DC
  Administered 2020-07-25: 5 mg via ORAL
  Filled 2020-07-25: qty 1

## 2020-07-25 MED ORDER — POTASSIUM CHLORIDE CRYS ER 20 MEQ PO TBCR
40.0000 meq | EXTENDED_RELEASE_TABLET | Freq: Once | ORAL | Status: AC
  Administered 2020-07-25: 40 meq via ORAL
  Filled 2020-07-25: qty 2

## 2020-07-25 MED ORDER — POTASSIUM CHLORIDE ER 10 MEQ PO TBCR: 10.0000 meq | EXTENDED_RELEASE_TABLET | Freq: Every day | ORAL | 1 refills | Status: DC

## 2020-07-25 MED ORDER — APIXABAN 5 MG PO TABS: 5.0000 mg | ORAL_TABLET | Freq: Two times a day (BID) | ORAL | 2 refills | Status: DC

## 2020-07-25 NOTE — ED Triage Notes (Signed)
Pt from home via [facility]. Pt reports SHOB and chest discomfort that started today. Pt states he was dx with A-fib in January of 2022.

## 2020-07-25 NOTE — Discharge Instructions (Addendum)
We started you on blood thinners today.  You will take Eliquis twice per day, 5 mg each time, until you see your cardiologist.  They will decide whether you should continue this medicine.  Do NOT take indomethacine, aspirin, aleve, advil, ibuprofen, or NSAIDS with eliquis.  These will thin your blood too much.  *  Tonight if your heart rate jumps up again and stays high (above 130 beats per minute) take 50 mg of your metoprolol.  Tomorrow increase your home metoprolol to 50 mg twice per day.   Monday pick up your prescriptions from the pharmacy.  You'll start taking metoprolol succinate (long acting) 50 mg twice per day, as well as potassium tablets, and eliquis.  Please follow up with your cardiologist as scheduled, as well as your primary care doctor this week.

## 2020-07-25 NOTE — ED Provider Notes (Signed)
[facility] EMERGENCY DEPARTMENT Provider Note   CSN: [id number] Arrival date & time: 07/25/20  1801     History Chief Complaint  Patient presents with  . Chest Pain    Pedro Gibbs is a 65 y.o. male history of diabetes, hypertension, reported history of A. fib, presenting to emergency department with palpitations.  The patient reports he felt onset of his palpitations earlier today.  Is been feeling his heart rate has been high especially with walking.  He says it feels like prior episodes In the past.  He reports 2 hospital visits in the past 2 months for said these symptoms and being told that he has A. fib.  He is on metoprolol 25 mg twice and compliant. He is not on any blood thinners.  He has an upcoming cardiology appointment on March 5 in [place].  HPI     Past Medical History:  Diagnosis Date  . Cancer ([place])   . Diabetes mellitus without complication ([place])   . Hypertension     There are no problems to display for this patient.   Past Surgical History:  Procedure Laterality Date  . SHOULDER ARTHROSCOPY Right    Dr Lemmie Evens        Family History  Problem Relation Age of Onset  . Diabetes Mother   . Healthy Father     Social History   Tobacco Use  . Smoking status: Never Smoker  . Smokeless tobacco: Never Used  Substance Use Topics  . Alcohol use: Never  . Drug use: Never    Home Medications Prior to Admission medications   Medication Sig Start Date End Date Taking? Authorizing Provider  allopurinol (ZYLOPRIM) 300 MG tablet Take 300 mg by mouth daily as needed.  02/07/19  Yes [provider]  apixaban (ELIQUIS) 5 MG TABS tablet Take 1 tablet (5 mg total) by mouth 2 (two) times daily. 07/25/20 08/24/20 Yes Hadassa Cermak, Carola Rhine, MD  diphenhydrAMINE-APAP, sleep, (TYLENOL PM EXTRA STRENGTH PO) Take 1 tablet by mouth daily.   Yes [provider]  lisinopril (ZESTRIL) 20 MG tablet Take 20 mg by mouth daily. 04/12/19  Yes [provider]   metoprolol succinate (TOPROL-XL) 25 MG 24 hr tablet Take 2 tablets (50 mg total) by mouth in the morning and at bedtime. 07/25/20 08/24/20 Yes Tanvi Gatling, Carola Rhine, MD  metoprolol tartrate (LOPRESSOR) 25 MG tablet Take 25 mg by mouth 2 (two) times daily. 07/17/20  Yes [provider]  Omega-3 1000 MG CAPS Take 1 capsule by mouth daily.   Yes [provider]  potassium chloride (KLOR-CON) 10 MEQ tablet Take 1 tablet (10 mEq total) by mouth daily. 07/25/20 09/23/20 Yes Margaree Sandhu, Carola Rhine, MD  rosuvastatin (CRESTOR) 10 MG tablet Take 10 mg by mouth daily.   Yes [provider]  sildenafil (REVATIO) 20 MG tablet Take 40 mg by mouth daily. 06/04/20  Yes [provider]    Allergies    Asa [aspirin]  Review of Systems   Review of Systems  Constitutional: Negative for chills and fever.  HENT: Negative for ear pain and sore throat.   Eyes: Negative for pain and visual disturbance.  Respiratory: Positive for shortness of breath. Negative for cough.   Cardiovascular: Positive for palpitations. Negative for chest pain.  Gastrointestinal: Negative for abdominal pain and vomiting.  Genitourinary: Negative for dysuria and hematuria.  Musculoskeletal: Negative for arthralgias and back pain.  Skin: Negative for color change and rash.  Neurological: Positive for light-headedness. Negative  for syncope.  All other systems reviewed and are negative.   Physical Exam Updated Vital Signs BP (!) 157/93   Pulse 66   Temp 98.6 F (37 C) (Oral)   Resp 15   SpO2 100%   Physical Exam Constitutional:      General: He is not in acute distress. HENT:     Head: Normocephalic and atraumatic.  Eyes:     Conjunctiva/sclera: Conjunctivae normal.     Pupils: Pupils are equal, round, and reactive to light.  Cardiovascular:     Rate and Rhythm: Tachycardia present. Rhythm irregular.  Pulmonary:     Effort: Pulmonary effort is normal. No respiratory distress.  Abdominal:      General: There is no distension.     Tenderness: There is no abdominal tenderness.  Skin:    General: Skin is warm and dry.  Neurological:     General: No focal deficit present.     Mental Status: He is alert. Mental status is at baseline.  Psychiatric:        Mood and Affect: Mood normal.        Behavior: Behavior normal.     ED Results / Procedures / Treatments   Labs (all labs ordered are listed, but only abnormal results are displayed) Labs Reviewed  BASIC METABOLIC PANEL - Abnormal; Notable for the following components:      Result Value   CO2 21 (*)    Glucose, Bld 106 (*)    All other components within normal limits  CBC WITH DIFFERENTIAL/PLATELET  MAGNESIUM    EKG EKG Interpretation  Date/Time:  Saturday July 25 2020 18:16:39 EST Ventricular Rate:  120 PR Interval:    QRS Duration: 81 QT Interval:  319 QTC Calculation: 451 R Axis:   49 Text Interpretation: Atrial fibrillation Minimal ST depression, diffuse leads No STEMi Confirmed by Octaviano Glow [phone]) on 07/25/2020 7:08:58 PM   Radiology No results found.  Procedures Procedures   Medications Ordered in ED Medications  metoprolol tartrate (LOPRESSOR) injection 5 mg (5 mg Intravenous Given 07/25/20 1949)  potassium chloride SA (KLOR-CON) CR tablet 40 mEq (40 mEq Oral Given 07/25/20 2041)    ED Course  I have reviewed the triage vital signs and the nursing notes.  Pertinent labs & imaging results that were available during my care of the patient were reviewed by me and considered in my medical decision making (see chart for details).  65 yo male presenting to ED with episode of suspected paroxysmal A Fib - which has been intermittently occurring for the past 2 months.  Prior hospitalization in [place] for this, but I do not have their records.    ECG personally reviewed - it appears to be A Fib vs another SVT such as MAT - although he has no significant pulmonary disease.  I reviewed his labs  today -  K mildly low at 3.6, Mg 1.8.  Gave oral potassium.  IV metoprolol given for rate control with improvement of HR back into sinus rhythm with rate 70-80 bpm.  I discussed the case with cardiology as noted below.  We've opted to start the patient on eliquis until he can see his cardiologist for an office visit.  I discussed the risks and benefits of eliquis, and signs of anemia and bleeding, and trauma precautions.  We discused the alternative of watchful waiting without eliquis - however I did recommend anticoagulation as he has now likely had this irregular rhythm for several months and has  a CHADSVASC2 score of at least 2.  The patient verbalized understanding and wishes to start eliquis.  We'll also increase his home metoprolol and switch to XL formulary - now he will take 50 mg BID.    Clinical Course as of 07/26/20 1055  Sat Jul 25, 2020  2002 HR now 80-90 bpm and appears sinus with very frequent PAC's [MT]  2022 K 3.6, Mg 1.8 [MT]  2048 I spoke to Dr Dallas Breeding from cardiology who recommends Eliquis 5 mg BID and increased metoprolol to succinate 50 mg BID - this is LIKELY A fib but may also be another SVT [MT]    Clinical Course User Index [MT] Ramisa Duman, Carola Rhine, MD    Final Clinical Impression(s) / ED Diagnoses Final diagnoses:  Palpitations  Paroxysmal atrial fibrillation [facility])    Rx / DC Orders ED Discharge Orders         Ordered    apixaban (ELIQUIS) 5 MG TABS tablet  2 times daily        07/25/20 2110    potassium chloride (KLOR-CON) 10 MEQ tablet  Daily        07/25/20 2110    metoprolol succinate (TOPROL-XL) 25 MG 24 hr tablet  2 times daily        07/25/20 2110           Wyvonnia Dusky, MD 07/26/20 1055

## 2020-08-07 ENCOUNTER — Encounter: Payer: Self-pay | Admitting: Cardiology

## 2020-08-07 ENCOUNTER — Other Ambulatory Visit: Payer: Self-pay

## 2020-08-07 ENCOUNTER — Other Ambulatory Visit: Payer: Self-pay | Admitting: Cardiology

## 2020-08-07 ENCOUNTER — Ambulatory Visit (INDEPENDENT_AMBULATORY_CARE_PROVIDER_SITE_OTHER): Payer: Medicare HMO

## 2020-08-07 ENCOUNTER — Ambulatory Visit: Payer: Medicare HMO | Admitting: Cardiology

## 2020-08-07 VITALS — BP 152/68 | HR 60 | Ht 72.0 in | Wt 171.0 lb

## 2020-08-07 DIAGNOSIS — I480 Paroxysmal atrial fibrillation: Secondary | ICD-10-CM

## 2020-08-07 DIAGNOSIS — E782 Mixed hyperlipidemia: Secondary | ICD-10-CM | POA: Diagnosis not present

## 2020-08-07 DIAGNOSIS — I10 Essential (primary) hypertension: Secondary | ICD-10-CM

## 2020-08-07 NOTE — Progress Notes (Signed)
Cardiology Office Note  Date: 08/07/2020   ID: Aum, Caggiano September 20, 1955, MRN [id number]  PCP:  Wannetta Sender, FNP  Cardiologist:  Rozann Lesches, MD Electrophysiologist:  None   Chief Complaint  Patient presents with  . Atrial Fibrillation    History of Present Illness: Pedro Gibbs is a 64 y.o. male referred for cardiology consultation by Mr. Lorel Monaco NP for the evaluation of atrial fibrillation.  He states that he has had an intermittent sense of very brief palpitations for quite some time, but this got worse in December 2021.  He has had more prolonged events since then, more frequent events.  He has been seen at the ER in [place], more recently at [facility].  Describes a sense of lower sternal discomfort associated with palpitations and subsequently short of breath and unease.  ER visit noted at [facility] on February 19 with paroxysmal atrial fibrillation, discussed with on-call cardiology with recommendation to initiate Eliquis and increase in metoprolol to 50 mg twice daily. I personally reviewed the ECG from 07/25/2020 which shows probable atrial fibrillation/atypical flutter transitioning into sinus rhythm. CHA2DS2-VASc score is at least 2.  Interestingly, he states that most of the symptoms tend to occur in the evenings.  Since being on higher dose metoprolol, he has had 3 breakthrough episodes, longest lasted for about an hour.  He reports tolerating Eliquis, no bleeding problems.  At baseline he feels well, works outdoors, no angina symptoms with exertion, good stamina.  Past Medical History:  Diagnosis Date  . Anemia   . Essential hypertension   . Mixed hyperlipidemia     Past Surgical History:  Procedure Laterality Date  . BACK SURGERY    . SHOULDER ARTHROSCOPY Right     Current Outpatient Medications  Medication Sig Dispense Refill  . allopurinol (ZYLOPRIM) 300 MG tablet Take 300 mg by mouth daily as needed.     Marland Kitchen apixaban (ELIQUIS) 5  MG TABS tablet Take 1 tablet (5 mg total) by mouth 2 (two) times daily. 60 tablet 2  . diphenhydrAMINE-APAP, sleep, (TYLENOL PM EXTRA STRENGTH PO) Take 1 tablet by mouth daily.    Marland Kitchen lisinopril (ZESTRIL) 20 MG tablet Take 20 mg by mouth daily.    . metoprolol succinate (TOPROL-XL) 25 MG 24 hr tablet Take 2 tablets (50 mg total) by mouth in the morning and at bedtime. 120 tablet 1  . Omega-3 1000 MG CAPS Take 1 capsule by mouth daily.    . potassium chloride (KLOR-CON) 10 MEQ tablet Take 1 tablet (10 mEq total) by mouth daily. 60 tablet 1  . rosuvastatin (CRESTOR) 10 MG tablet Take 10 mg by mouth daily.    . sildenafil (REVATIO) 20 MG tablet Take 40 mg by mouth daily.     No current facility-administered medications for this visit.   Allergies:  Asa [aspirin]   Social History: The patient  reports that he has never smoked. He has never used smokeless tobacco. He reports current alcohol use. He reports that he does not use drugs.   Family History: The patient's family history includes Stroke in his father.   ROS: No syncope.  Physical Exam: VS:  BP (!) 152/68 (BP Location: Right Arm)   Pulse 60   Ht 6' (1.829 m)   Wt 171 lb (77.6 kg)   SpO2 99%   BMI 23.19 kg/m , BMI Body mass index is 23.19 kg/m.  Wt Readings from Last 3 Encounters:  08/07/20 171 lb (77.6  kg)  04/25/19 170 lb (77.1 kg)  07/02/18 165 lb (74.8 kg)    General: Patient appears comfortable at rest. HEENT: Conjunctiva and lids normal, wearing a mask. Neck: Supple, no elevated JVP or carotid bruits, no thyromegaly. Lungs: Clear to auscultation, nonlabored breathing at rest. Cardiac: Regular rate and rhythm, no S3 or significant systolic murmur, no pericardial rub. Abdomen: Soft, nontender, bowel sounds present. Extremities: No pitting edema, distal pulses 2+. Skin: Warm and dry. Musculoskeletal: No kyphosis. Neuropsychiatric: Alert and oriented x3, affect grossly appropriate.  ECG:  No outside tracings available  for review.  Recent Labwork: 07/25/2020: BUN 13; Creatinine, Ser 0.81; Hemoglobin 13.8; Magnesium 1.8; Platelets 235; Potassium 3.6; Sodium 139   Other Studies Reviewed Today:  Prior cardiac testing for review.  Assessment and Plan:  1.  Paroxysmal atrial fibrillation/atypical atrial flutter based on limited rhythm documentation by ECG.  CHA2DS2-VASc score is at least 2.  Agree with initiation of Eliquis and current dose of Toprol-XL.  We will obtain a 14-day cardiac monitor for further investigation of both rhythm and frequency.  Suspect he may well need antiarrhythmic therapy.  Baseline echocardiogram will be obtained.  Further plans to follow.  2.  Essential hypertension, he is also on lisinopril with follow-up by PCP.  3.  Mixed hyperlipidemia, on Crestor.  Medication Adjustments/Labs and Tests Ordered: Current medicines are reviewed at length with the patient today.  Concerns regarding medicines are outlined above.   Tests Ordered: Orders Placed This Encounter  Procedures  . ECHOCARDIOGRAM COMPLETE    Medication Changes: No orders of the defined types were placed in this encounter.   Disposition:  Follow up test results.  Signed, Satira Sark, MD, [facility] 08/07/2020 10:57 AM    [place] [facility] [place]. [address] [place], [place] [address] Phone: [phone]; Fax: [phone]

## 2020-08-07 NOTE — Patient Instructions (Signed)
Medication Instructions:  Your physician recommends that you continue on your current medications as directed. Please refer to the Current Medication list given to you today.  *If you need a refill on your cardiac medications before your next appointment, please call your pharmacy*   Lab Work: None today If you have labs (blood work) drawn today and your tests are completely normal, you will receive your results only by: Marland Kitchen MyChart Message (if you have MyChart) OR . A paper copy in the mail If you have any lab test that is abnormal or we need to change your treatment, we will call you to review the results.   Testing/Procedures: Your physician has requested that you have an echocardiogram. Echocardiography is a painless test that uses sound waves to create images of your heart. It provides your doctor with information about the size and shape of your heart and how well your heart's chambers and valves are working. This procedure takes approximately one hour. There are no restrictions for this procedure.  Your physician has recommended that you wear an event monitor.(Zio for 14 days)  Event monitors are medical devices that record the heart's electrical activity. Doctors most often [place] these monitors to diagnose arrhythmias. Arrhythmias are problems with the speed or rhythm of the heartbeat. The monitor is a small, portable device. You can wear one while you do your normal daily activities. This is usually used to diagnose what is causing palpitations/syncope (passing out).     Follow-Up: to be determined after test results are complete.    ZIO XT- Long Term Monitor Instructions   Your physician has requested you wear your ZIO patch monitor____14___days.   This is a single patch monitor.  [employer] supplies one patch monitor per enrollment.  Additional stickers are not available.   Please do not apply patch if you will be having a Nuclear Stress Test, Echocardiogram, Cardiac CT, MRI, or  Chest Xray during the time frame you would be wearing the monitor. The patch cannot be worn during these tests.  You cannot remove and re-apply the ZIO XT patch monitor.       Do not shower for the first 24 hours.  You may shower after the first 24 hours.   Press button if you feel a symptom. You will hear a small click.  Record Date, Time and Symptom in the Patient Log Book.   When you are ready to remove patch, follow instructions on last 2 pages of Patient Log Book.  Stick patch monitor onto last page of Patient Log Book.   Place Patient Log Book in [place] box.  Use locking tab on box and tape box closed securely.  The [employer] has [employer] on it.  Please place in mailbox as soon as possible.  Your physician should have your test results approximately 7 days after the monitor has been mailed back to [facility].   Call [place] at [phone] if you have questions regarding your ZIO XT patch monitor.  Call them immediately if you see an orange light blinking on your monitor.   If your monitor falls off in less than 4 days contact our Monitor department at [phone].  If your monitor becomes loose or falls off after 4 days call [employer] at [phone] for suggestions on securing your monitor.        Thank you for choosing [facility] !

## 2020-08-28 ENCOUNTER — Ambulatory Visit (HOSPITAL_COMMUNITY)
Admission: RE | Admit: 2020-08-28 | Discharge: 2020-08-28 | Disposition: A | Discharge status: Home / Self Care | Payer: Medicare HMO | Source: Ambulatory Visit | Attending: Cardiology | Admitting: Cardiology

## 2020-08-28 ENCOUNTER — Other Ambulatory Visit: Payer: Self-pay

## 2020-08-28 DIAGNOSIS — I480 Paroxysmal atrial fibrillation: Secondary | ICD-10-CM | POA: Insufficient documentation

## 2020-08-28 LAB — ECHOCARDIOGRAM COMPLETE
Area-P 1/2: 2.97 cm2
S' Lateral: 2.7 cm

## 2020-08-28 NOTE — Progress Notes (Signed)
*  PRELIMINARY RESULTS* Echocardiogram 2D Echocardiogram has been performed.  Pedro Gibbs 08/28/2020, 10:06 AM

## 2020-08-31 ENCOUNTER — Telehealth: Payer: Self-pay

## 2020-08-31 DIAGNOSIS — I480 Paroxysmal atrial fibrillation: Secondary | ICD-10-CM

## 2020-08-31 NOTE — Telephone Encounter (Signed)
Results of both echo and cardiac monitor given to patient. He is aware the referral to A-fib clinic was placed.Follow up appointment for 12/17/20 made with Dr.McDowell.

## 2020-09-09 NOTE — Progress Notes (Signed)
Primary Care Physician: Wannetta Sender, FNP Primary Cardiologist: Dr Domenic Polite Primary Electrophysiologist: none Referring Physician: Dr Rafael Bihari is a 65 y.o. male with a history of HTN, HLD, atrial flutter, and atrial fibrillation who presents [facility].  The patient was initially diagnosed with atrial fibrillation 07/25/20 after presenting to [facility] ED with tachypalpitations and chest tightness. Patient is on Eliquis for a CHADS2VASC score of 2. He wore a cardiac monitor which showed atypical atrial flutter and atrial fibrillation with a 2% overall burden. He states that since starting metoprolol, his afib episodes are less symptomatic but still occur about once every other week. He denies significant snoring and drinks 4-5 beers per week.   Today, he denies symptoms of chest pain, shortness of breath, orthopnea, PND, lower extremity edema, dizziness, presyncope, syncope, snoring, daytime somnolence, bleeding, or neurologic sequela. The patient is tolerating medications without difficulties and is otherwise without complaint today.    Atrial Fibrillation Risk Factors:  he does not have symptoms or diagnosis of sleep apnea. he does not have a history of rheumatic fever. he does have a history of alcohol use. The patient does have a history of early familial atrial fibrillation or other arrhythmias. Cousin/niece have afib.  he has a BMI of Body mass index is 23.25 kg/m.Marland Kitchen Filed Weights   09/10/20 0854  Weight: 77.7 kg    Family History  Problem Relation Age of Onset  . Stroke Father      Atrial Fibrillation Management history:  Previous antiarrhythmic drugs: none Previous cardioversions: none Previous ablations: none CHADS2VASC score: 2 Anticoagulation history: Eliquis   Past Medical History:  Diagnosis Date  . Anemia   . Essential hypertension   . Mixed hyperlipidemia    Past Surgical  History:  Procedure Laterality Date  . BACK SURGERY    . SHOULDER ARTHROSCOPY Right     Current Outpatient Medications  Medication Sig Dispense Refill  . allopurinol (ZYLOPRIM) 300 MG tablet Take 300 mg by mouth daily as needed.     Marland Kitchen apixaban (ELIQUIS) 5 MG TABS tablet Take 1 tablet (5 mg total) by mouth 2 (two) times daily. 60 tablet 2  . diphenhydrAMINE-APAP, sleep, (TYLENOL PM EXTRA STRENGTH PO) Take 1 tablet by mouth daily.    Marland Kitchen lisinopril (ZESTRIL) 20 MG tablet Take 20 mg by mouth daily.    . metoprolol succinate (TOPROL-XL) 25 MG 24 hr tablet Take 2 tablets (50 mg total) by mouth in the morning and at bedtime. 120 tablet 1  . Omega-3 1000 MG CAPS Take 1 capsule by mouth daily.    . potassium chloride (KLOR-CON) 10 MEQ tablet Take 1 tablet (10 mEq total) by mouth daily. 60 tablet 1  . rosuvastatin (CRESTOR) 10 MG tablet Take 10 mg by mouth daily.    . sildenafil (REVATIO) 20 MG tablet Take 40 mg by mouth daily.     No current facility-administered medications for this encounter.    Allergies  Allergen Reactions  . Asa [Aspirin] Nausea Only    Social History   Socioeconomic History  . Marital status: Widowed    Spouse name: Not on file  . Number of children: Not on file  . Years of education: Not on file  . Highest education level: Not on file  Occupational History  . Not on file  Tobacco Use  . Smoking status: Former Smoker    Types: Cigarettes    Quit  date: 2005    Years since quitting: 17.2  . Smokeless tobacco: Never Used  Vaping Use  . Vaping Use: Never used  Substance and Sexual Activity  . Alcohol use: Yes    Alcohol/week: 3.0 standard drinks    Types: 3 Cans of beer per week    Comment: rare   . Drug use: Never  . Sexual activity: Not on file  Other Topics Concern  . Not on file  Social History Narrative  . Not on file   Social Determinants of Health   Financial Resource Strain: Not on file  Food Insecurity: Not on file  Transportation Needs:  Not on file  Physical Activity: Not on file  Stress: Not on file  Social Connections: Not on file  Intimate Partner Violence: Not on file     ROS- All systems are reviewed and negative except as per the HPI above.  Physical Exam: Vitals:   09/10/20 0854  BP: (!) 152/88  Pulse: 71  Weight: 77.7 kg  Height: 6' (1.829 m)    GEN- The patient is a well appearing male, alert and oriented x 3 today. Head- normocephalic, atraumatic Eyes-  Sclera clear, conjunctiva pink Ears- hearing intact Oropharynx- clear Neck- supple  Lungs- Clear to ausculation bilaterally, normal work of breathing Heart- Regular rate and rhythm, no murmurs, rubs or gallops  GI- soft, NT, ND, + BS Extremities- no clubbing, cyanosis, or edema MS- no significant deformity or atrophy Skin- no rash or lesion Psych- euthymic mood, full affect Neuro- strength and sensation are intact  Wt Readings from Last 3 Encounters:  09/10/20 77.7 kg  08/07/20 77.6 kg  04/25/19 77.1 kg    EKG today demonstrates  SR Vent. rate 71 BPM PR interval 148 ms QRS duration 78 ms QT/QTcB 374/406 ms  Echo 08/28/20 demonstrated  1. Left ventricular ejection fraction, by estimation, is 60 to 65%. The  left ventricle has normal function. The left ventricle has no regional  wall motion abnormalities. Left ventricular diastolic parameters were  normal.  2. Right ventricular systolic function is normal. The right ventricular  size is normal.  3. The mitral valve is normal in structure. Mild mitral valve  regurgitation.  4. The aortic valve is tricuspid. Aortic valve regurgitation is not  visualized. Mild aortic valve sclerosis is present, with no evidence of  aortic valve stenosis.  5. The inferior vena cava is normal in size with greater than 50%  respiratory variability, suggesting right atrial pressure of 3 mmHg.   Epic records are reviewed at length today  CHA2DS2-VASc Score = 2  The patient's score is based  upon: CHF History: No HTN History: Yes Diabetes History: No Stroke History: No Vascular Disease History: No Age Score: 1 Gender Score: 0      ASSESSMENT AND PLAN: 1. Paroxysmal Atrial Fibrillation/atrial flutter The patient's CHA2DS2-VASc score is 2, indicating a 2.2% annual risk of stroke.   General education about afib provided and questions answered. We also discussed his stroke risk and the risks and benefits of anticoagulation. We discussed therapeutic options for his afib including AAD vs ablation. Will start flecainide 50 mg BID. Plan for treadmill stress test after starting on medication. If he fails flecainide, he would be interested in ablation.  Continue Toprol 50 mg BID Continue Eliquis 5 mg BID  2. Secondary Hypercoagulable State (ICD10:  D68.69) The patient is at significant risk for stroke/thromboembolism based upon his CHA2DS2-VASc Score of 2.  Continue Apixaban (Eliquis).   3.  HTN Mildly elevated today. Possibly anxious about coming into clinic today. Will continue to monitor. Can consider increasing lisinopril.    Follow up in the AF clinic next week for ECG.    [facility] [address] [place], [place] [address] [phone] 09/10/2020 9:06 AM

## 2020-09-10 ENCOUNTER — Other Ambulatory Visit: Payer: Self-pay

## 2020-09-10 ENCOUNTER — Encounter (HOSPITAL_COMMUNITY): Payer: Self-pay | Admitting: Physician Assistant

## 2020-09-10 ENCOUNTER — Ambulatory Visit (HOSPITAL_COMMUNITY)
Admission: RE | Admit: 2020-09-10 | Discharge: 2020-09-10 | Disposition: A | Discharge status: Home / Self Care | Payer: Medicare HMO | Source: Ambulatory Visit | Attending: Physician Assistant | Admitting: Physician Assistant

## 2020-09-10 VITALS — BP 152/88 | HR 71 | Ht 72.0 in | Wt 171.4 lb

## 2020-09-10 DIAGNOSIS — Z7901 Long term (current) use of anticoagulants: Secondary | ICD-10-CM | POA: Diagnosis not present

## 2020-09-10 DIAGNOSIS — D6869 Other thrombophilia: Secondary | ICD-10-CM | POA: Diagnosis not present

## 2020-09-10 DIAGNOSIS — Z79899 Other long term (current) drug therapy: Secondary | ICD-10-CM | POA: Diagnosis not present

## 2020-09-10 DIAGNOSIS — Z886 Allergy status to analgesic agent status: Secondary | ICD-10-CM | POA: Diagnosis not present

## 2020-09-10 DIAGNOSIS — I480 Paroxysmal atrial fibrillation: Secondary | ICD-10-CM | POA: Diagnosis not present

## 2020-09-10 DIAGNOSIS — Z87891 Personal history of nicotine dependence: Secondary | ICD-10-CM | POA: Insufficient documentation

## 2020-09-10 DIAGNOSIS — I4892 Unspecified atrial flutter: Secondary | ICD-10-CM | POA: Insufficient documentation

## 2020-09-10 DIAGNOSIS — I10 Essential (primary) hypertension: Secondary | ICD-10-CM | POA: Insufficient documentation

## 2020-09-10 MED ORDER — FLECAINIDE ACETATE 50 MG PO TABS: 50.0000 mg | ORAL_TABLET | Freq: Two times a day (BID) | ORAL | 3 refills | Status: DC

## 2020-09-10 NOTE — Patient Instructions (Signed)
Start Flecainide 50mg twice a day 

## 2020-09-15 ENCOUNTER — Other Ambulatory Visit: Payer: Self-pay

## 2020-09-15 ENCOUNTER — Ambulatory Visit (HOSPITAL_COMMUNITY)
Admission: RE | Admit: 2020-09-15 | Discharge: 2020-09-15 | Disposition: A | Discharge status: Home / Self Care | Payer: Medicare HMO | Source: Ambulatory Visit | Attending: Physician Assistant | Admitting: Physician Assistant

## 2020-09-15 VITALS — BP 138/74 | HR 69

## 2020-09-15 DIAGNOSIS — I480 Paroxysmal atrial fibrillation: Secondary | ICD-10-CM | POA: Diagnosis present

## 2020-09-15 NOTE — Progress Notes (Signed)
Patient returns for ECG after starting flecainide. ECG shows SR HR 69, PR 160, QRS 86, QTc 422. He reports no palpitations since starting the medication. Continue 50 mg BID. Will arrange for TST. F/u with Dr Domenic Polite as scheduled and AF clinic in 6 months.

## 2020-10-08 ENCOUNTER — Other Ambulatory Visit (HOSPITAL_COMMUNITY): Payer: Self-pay | Admitting: *Deleted

## 2020-10-08 MED ORDER — METOPROLOL SUCCINATE ER 50 MG PO TB24: 50.0000 mg | ORAL_TABLET | Freq: Two times a day (BID) | ORAL | 6 refills | Status: DC

## 2020-10-23 ENCOUNTER — Other Ambulatory Visit (HOSPITAL_COMMUNITY): Payer: Self-pay

## 2020-10-23 MED ORDER — APIXABAN 5 MG PO TABS: 5.0000 mg | ORAL_TABLET | Freq: Two times a day (BID) | ORAL | 6 refills | Status: DC

## 2020-11-03 ENCOUNTER — Other Ambulatory Visit: Payer: Self-pay

## 2020-11-03 ENCOUNTER — Ambulatory Visit: Payer: Medicare HMO

## 2020-11-05 ENCOUNTER — Ambulatory Visit (INDEPENDENT_AMBULATORY_CARE_PROVIDER_SITE_OTHER): Payer: Medicare HMO

## 2020-11-05 ENCOUNTER — Other Ambulatory Visit: Payer: Self-pay

## 2020-11-05 DIAGNOSIS — I480 Paroxysmal atrial fibrillation: Secondary | ICD-10-CM | POA: Diagnosis not present

## 2020-11-05 LAB — EXERCISE TOLERANCE TEST
Estimated workload: 4.9 METS
Exercise duration (min): 3 min
Exercise duration (sec): 19 s
MPHR: 154 {beats}/min
Peak HR: 113 {beats}/min
Percent HR: 73 %
RPE: 16
Rest HR: 57 {beats}/min

## 2020-12-03 ENCOUNTER — Encounter: Payer: Self-pay | Admitting: Internal Medicine

## 2020-12-15 ENCOUNTER — Ambulatory Visit (HOSPITAL_COMMUNITY): Payer: Medicare HMO | Admitting: Physician Assistant

## 2020-12-17 ENCOUNTER — Encounter: Payer: Self-pay | Admitting: Cardiology

## 2020-12-17 ENCOUNTER — Ambulatory Visit: Payer: Medicare HMO | Admitting: Cardiology

## 2020-12-17 ENCOUNTER — Other Ambulatory Visit: Payer: Self-pay

## 2020-12-17 VITALS — BP 140/70 | HR 57 | Ht 72.0 in | Wt 173.0 lb

## 2020-12-17 DIAGNOSIS — Z79899 Other long term (current) drug therapy: Secondary | ICD-10-CM | POA: Diagnosis not present

## 2020-12-17 DIAGNOSIS — D6869 Other thrombophilia: Secondary | ICD-10-CM | POA: Diagnosis not present

## 2020-12-17 DIAGNOSIS — I480 Paroxysmal atrial fibrillation: Secondary | ICD-10-CM | POA: Diagnosis not present

## 2020-12-17 NOTE — Patient Instructions (Signed)
Medication Instructions:  Your physician recommends that you continue on your current medications as directed. Please refer to the Current Medication list given to you today.  *If you need a refill on your cardiac medications before your next appointment, please call your pharmacy*   Lab Work: CBC BMET If you have labs (blood work) drawn today and your tests are completely normal, you will receive your results only by: [place] (if you have MyChart) OR A paper copy in the mail If you have any lab test that is abnormal or we need to change your treatment, we will call you to review the results.   Testing/Procedures: None   Follow-Up: At [facility], you and your health needs are our priority.  As part of our continuing mission to provide you with exceptional heart care, we have created designated Provider Care Teams.  These Care Teams include your primary Cardiologist (physician) and Advanced Practice Providers (APPs -  Physician Assistants and Nurse Practitioners) who all work together to provide you with the care you need, when you need it.  We recommend signing up for the patient portal called "MyChart".  Sign up information is provided on this After Visit Summary.  MyChart is used to connect with patients for Virtual Visits (Telemedicine).  Patients are able to view lab/test results, encounter notes, upcoming appointments, etc.  Non-urgent messages can be sent to your provider as well.   To learn more about what you can do with MyChart, go to [email or web address].    Your next appointment:   6 month(s)  The format for your next appointment:   In Person  Provider:   Rozann Lesches, MD   Other Instructions

## 2020-12-17 NOTE — Progress Notes (Signed)
Cardiology Office Note  Date: 12/17/2020   ID: Tanav, Orsak Aug 24, 1955, MRN [id number]  PCP:  Wannetta Sender, FNP  Cardiologist:  Rozann Lesches, MD Electrophysiologist:  None   Chief Complaint  Patient presents with   Cardiac follow-up    History of Present Illness: Pedro Gibbs is a 65 y.o. male last seen in March.  He has had interval follow-up in the atrial fibrillation clinic, I reviewed the note.  He has been initiated on flecainide for suppression of atrial fibrillation/flutter.  Follow-up GXT is noted below.  He is here for follow-up, states that he has done very well with his current medications, no breakthrough palpitations.  He does not report any spontaneous bleeding problems on Eliquis.  No sudden dizziness or syncope.  Past Medical History:  Diagnosis Date   Anemia    Essential hypertension    Mixed hyperlipidemia     Past Surgical History:  Procedure Laterality Date   BACK SURGERY     SHOULDER ARTHROSCOPY Right     Current Outpatient Medications  Medication Sig Dispense Refill   allopurinol (ZYLOPRIM) 300 MG tablet Take 300 mg by mouth daily as needed.      apixaban (ELIQUIS) 5 MG TABS tablet Take 1 tablet (5 mg total) by mouth 2 (two) times daily. 60 tablet 6   diphenhydrAMINE-APAP, sleep, (TYLENOL PM EXTRA STRENGTH PO) Take 1 tablet by mouth daily.     flecainide (TAMBOCOR) 50 MG tablet Take 1 tablet (50 mg total) by mouth 2 (two) times daily. 60 tablet 3   lisinopril (ZESTRIL) 20 MG tablet Take 20 mg by mouth daily.     metoprolol succinate (TOPROL XL) 50 MG 24 hr tablet Take 1 tablet (50 mg total) by mouth 2 (two) times daily. Take with or immediately following a meal. 60 tablet 6   Omega-3 1000 MG CAPS Take 1 capsule by mouth daily.     rosuvastatin (CRESTOR) 10 MG tablet Take 10 mg by mouth daily.     sildenafil (REVATIO) 20 MG tablet Take 40 mg by mouth as needed.     No current facility-administered medications for this  visit.   Allergies:  Asa [aspirin]   ROS: No orthopnea or PND.  Physical Exam: VS:  BP 140/70   Pulse (!) 57   Ht 6' (1.829 m)   Wt 173 lb (78.5 kg)   SpO2 98%   BMI 23.46 kg/m , BMI Body mass index is 23.46 kg/m.  Wt Readings from Last 3 Encounters:  12/17/20 173 lb (78.5 kg)  09/10/20 171 lb 6.4 oz (77.7 kg)  08/07/20 171 lb (77.6 kg)    General: Patient appears comfortable at rest. HEENT: Conjunctiva and lids normal, wearing a mask. Neck: Supple, no elevated JVP or carotid bruits, no thyromegaly. Lungs: Clear to auscultation, nonlabored breathing at rest. Cardiac: Regular rate and rhythm, no S3 or significant systolic murmur. Extremities: No pitting edema.  ECG:  An ECG dated 09/15/2020 was personally reviewed today and demonstrated:  Normal sinus rhythm.  Recent Labwork: 07/25/2020: BUN 13; Creatinine, Ser 0.81; Hemoglobin 13.8; Magnesium 1.8; Platelets 235; Potassium 3.6; Sodium 139   Other Studies Reviewed Today:  Echocardiogram 08/28/2020:  1. Left ventricular ejection fraction, by estimation, is 60 to 65%. The  left ventricle has normal function. The left ventricle has no regional  wall motion abnormalities. Left ventricular diastolic parameters were  normal.   2. Right ventricular systolic function is normal. The right ventricular  size  is normal.   3. The mitral valve is normal in structure. Mild mitral valve  regurgitation.   4. The aortic valve is tricuspid. Aortic valve regurgitation is not  visualized. Mild aortic valve sclerosis is present, with no evidence of  aortic valve stenosis.   5. The inferior vena cava is normal in size with greater than 50%  respiratory variability, suggesting right atrial pressure of 3 mmHg.   Cardiac monitor March 2022: ZIO XT reviewed.  10 days 16 hours analyzed.  Predominant rhythm is sinus with heart rate ranging from 43 bpm up to 119 bpm and average heart rate 58 bpm.  Rare PACs were noted including couplets and triplets  representing less than 1% total beats.  There were also rare PVCs noted including couplets representing less than 1% total beats.  Atypical atrial flutter and possibly some atrial fibrillation was also noted, representing 2% rhythm burden.  Longest episode of atypical atrial flutter was approximately 12 minutes.  There were no pauses.  GXT 11/05/2020: Blood pressure demonstrated a hypertensive response to exercise. There was no ST segment deviation noted during stress. No T wave inversion was noted during stress.   IMPRESSIONS Negative stress induced arrhythmias. Rare PACs and PVCs in recovery Stress ECG nondiagnostic due to failure to meet target heart rate.   RECOMMENDATIONS/CONCLUSIONS Stress ECG nondiagnostic due to failure to meet target heart rate.  Assessment and Plan:  1.  Paroxysmal atrial fibrillation/flutter, CHA2DS2-VASc score of 2.  He is doing very well now in terms of rhythm suppression following initiation of flecainide and otherwise continues on Toprol-XL along with Eliquis.  GXT without proarrhythmia.  2.  Secondary hypercoagulable state in light of atrial fibrillation.  Continue Eliquis for stroke prophylaxis.  Follow-up CBC and BMET for next visit.  Medication Adjustments/Labs and Tests Ordered: Current medicines are reviewed at length with the patient today.  Concerns regarding medicines are outlined above.   Tests Ordered: Orders Placed This Encounter  Procedures   CBC   Basic metabolic panel     Medication Changes: No orders of the defined types were placed in this encounter.   Disposition:  Follow up  6 months.  Signed, Satira Sark, MD, [facility] 12/17/2020 9:38 AM    [facility] [place]. [address], [place], [place] [address] Phone: [phone]; Fax: [phone]

## 2021-01-03 ENCOUNTER — Other Ambulatory Visit (HOSPITAL_COMMUNITY): Payer: Self-pay | Admitting: Physician Assistant

## 2021-03-16 ENCOUNTER — Encounter (HOSPITAL_COMMUNITY): Payer: Self-pay | Admitting: Physician Assistant

## 2021-03-16 ENCOUNTER — Ambulatory Visit (HOSPITAL_COMMUNITY)
Admission: RE | Admit: 2021-03-16 | Discharge: 2021-03-16 | Disposition: A | Discharge status: Home / Self Care | Payer: Medicare HMO | Source: Ambulatory Visit | Attending: Physician Assistant | Admitting: Physician Assistant

## 2021-03-16 ENCOUNTER — Other Ambulatory Visit: Payer: Self-pay

## 2021-03-16 VITALS — BP 140/76 | HR 65 | Ht 72.0 in | Wt 173.6 lb

## 2021-03-16 DIAGNOSIS — D6869 Other thrombophilia: Secondary | ICD-10-CM | POA: Insufficient documentation

## 2021-03-16 DIAGNOSIS — Z79899 Other long term (current) drug therapy: Secondary | ICD-10-CM | POA: Insufficient documentation

## 2021-03-16 DIAGNOSIS — Z87891 Personal history of nicotine dependence: Secondary | ICD-10-CM | POA: Diagnosis not present

## 2021-03-16 DIAGNOSIS — E782 Mixed hyperlipidemia: Secondary | ICD-10-CM | POA: Insufficient documentation

## 2021-03-16 DIAGNOSIS — Z886 Allergy status to analgesic agent status: Secondary | ICD-10-CM | POA: Insufficient documentation

## 2021-03-16 DIAGNOSIS — I480 Paroxysmal atrial fibrillation: Secondary | ICD-10-CM | POA: Insufficient documentation

## 2021-03-16 DIAGNOSIS — I10 Essential (primary) hypertension: Secondary | ICD-10-CM | POA: Diagnosis not present

## 2021-03-16 DIAGNOSIS — Z7901 Long term (current) use of anticoagulants: Secondary | ICD-10-CM | POA: Diagnosis not present

## 2021-03-16 NOTE — Progress Notes (Signed)
Primary Care Physician: Wannetta Sender, FNP Primary Cardiologist: Dr Domenic Polite Primary Electrophysiologist: none Referring Physician: Dr Rafael Bihari is a 65 y.o. male with a history of HTN, HLD, atrial flutter, and atrial fibrillation who presents for follow up in the [facility].  The patient was initially diagnosed with atrial fibrillation 07/25/20 after presenting to [facility] ED with tachypalpitations and chest tightness. Patient is on Eliquis for a CHADS2VASC score of 2. He wore a cardiac monitor which showed atypical atrial flutter and atrial fibrillation with a 2% overall burden. He states that since starting metoprolol, his afib episodes are less symptomatic but still occur about once every other week. He denies significant snoring and drinks 4-5 beers per week.   On follow up today, patient reports that he has done very well since his last visit. He denies any heart racing since starting flecainide. He was acutely ill with a viral GI illness last week but this has resolved. He denies any bleeding issues on anticoagulation.   Today, he denies symptoms of palpitations, chest pain, shortness of breath, orthopnea, PND, lower extremity edema, dizziness, presyncope, syncope, snoring, daytime somnolence, bleeding, or neurologic sequela. The patient is tolerating medications without difficulties and is otherwise without complaint today.    Atrial Fibrillation Risk Factors:  he does not have symptoms or diagnosis of sleep apnea. he does not have a history of rheumatic fever. he does have a history of alcohol use. The patient does have a history of early familial atrial fibrillation or other arrhythmias. Cousin/niece have afib.  he has a BMI of Body mass index is 23.54 kg/m.Marland Kitchen Filed Weights   03/16/21 0840  Weight: 78.7 kg     Family History  Problem Relation Age of Onset   Stroke Father      Atrial Fibrillation Management  history:  Previous antiarrhythmic drugs: flecainide  Previous cardioversions: none Previous ablations: none CHADS2VASC score: 2 Anticoagulation history: Eliquis   Past Medical History:  Diagnosis Date   Anemia    Essential hypertension    Mixed hyperlipidemia    Past Surgical History:  Procedure Laterality Date   BACK SURGERY     SHOULDER ARTHROSCOPY Right     Current Outpatient Medications  Medication Sig Dispense Refill   allopurinol (ZYLOPRIM) 300 MG tablet Take 300 mg by mouth daily as needed.      apixaban (ELIQUIS) 5 MG TABS tablet Take 1 tablet (5 mg total) by mouth 2 (two) times daily. 60 tablet 6   diphenhydrAMINE-APAP, sleep, (TYLENOL PM EXTRA STRENGTH PO) Take 1 tablet by mouth daily.     flecainide (TAMBOCOR) 50 MG tablet TAKE (1) TABLET TWICE DAILY. 60 tablet 6   lisinopril (ZESTRIL) 20 MG tablet Take 20 mg by mouth daily.     metoprolol succinate (TOPROL XL) 50 MG 24 hr tablet Take 1 tablet (50 mg total) by mouth 2 (two) times daily. Take with or immediately following a meal. 60 tablet 6   Omega-3 1000 MG CAPS Take 1 capsule by mouth daily.     rosuvastatin (CRESTOR) 10 MG tablet Take 10 mg by mouth daily.     sildenafil (REVATIO) 20 MG tablet Take 40 mg by mouth as needed.     No current facility-administered medications for this encounter.    Allergies  Allergen Reactions   Asa [Aspirin] Nausea Only    Social History   Socioeconomic History   Marital status: Widowed    Spouse  name: Not on file   Number of children: Not on file   Years of education: Not on file   Highest education level: Not on file  Occupational History   Not on file  Tobacco Use   Smoking status: Former    Types: Cigarettes    Quit date: 2005    Years since quitting: 17.7   Smokeless tobacco: Never   Tobacco comments:    Former smoker 03/16/2021  Vaping Use   Vaping Use: Never used  Substance and Sexual Activity   Alcohol use: Yes    Alcohol/week: 3.0 standard drinks     Types: 3 Cans of beer per week    Comment: rare    Drug use: Never   Sexual activity: Not on file  Other Topics Concern   Not on file  Social History Narrative   Not on file   Social Determinants of Health   Financial Resource Strain: Not on file  Food Insecurity: Not on file  Transportation Needs: Not on file  Physical Activity: Not on file  Stress: Not on file  Social Connections: Not on file  Intimate Partner Violence: Not on file     ROS- All systems are reviewed and negative except as per the HPI above.  Physical Exam: Vitals:   03/16/21 0840  BP: 140/76  Pulse: 65  Weight: 78.7 kg  Height: 6' (1.829 m)    GEN- The patient is a well appearing male, alert and oriented x 3 today.   HEENT-head normocephalic, atraumatic, sclera clear, conjunctiva pink, hearing intact, trachea midline. Lungs- Clear to ausculation bilaterally, normal work of breathing Heart- Regular rate and rhythm, no murmurs, rubs or gallops  GI- soft, NT, ND, + BS Extremities- no clubbing, cyanosis, or edema MS- no significant deformity or atrophy Skin- no rash or lesion Psych- euthymic mood, full affect Neuro- strength and sensation are intact   Wt Readings from Last 3 Encounters:  03/16/21 78.7 kg  12/17/20 78.5 kg  09/10/20 77.7 kg    EKG today demonstrates  SR Vent. rate 65 BPM PR interval 168 ms QRS duration 94 ms QT/QTcB 390/405 ms  Echo 08/28/20 demonstrated  1. Left ventricular ejection fraction, by estimation, is 60 to 65%. The  left ventricle has normal function. The left ventricle has no regional  wall motion abnormalities. Left ventricular diastolic parameters were  normal.   2. Right ventricular systolic function is normal. The right ventricular  size is normal.   3. The mitral valve is normal in structure. Mild mitral valve  regurgitation.   4. The aortic valve is tricuspid. Aortic valve regurgitation is not  visualized. Mild aortic valve sclerosis is present, with no  evidence of  aortic valve stenosis.   5. The inferior vena cava is normal in size with greater than 50%  respiratory variability, suggesting right atrial pressure of 3 mmHg.   Epic records are reviewed at length today  CHA2DS2-VASc Score = 2  The patient's score is based upon: CHF History: 0 HTN History: 1 Diabetes History: 0 Stroke History: 0 Vascular Disease History: 0 Age Score: 1 Gender Score: 0      ASSESSMENT AND PLAN: 1. Paroxysmal Atrial Fibrillation/atrial flutter The patient's CHA2DS2-VASc score is 2, indicating a 2.2% annual risk of stroke.   Patient appears to be maintaining SR. Continue flecainide 50 mg BID If he fails flecainide, he would be interested in ablation.  Continue Toprol 50 mg BID Continue Eliquis 5 mg BID  2. Secondary Hypercoagulable  State (ICD10:  [id number]) The patient is at significant risk for stroke/thromboembolism based upon his CHA2DS2-VASc Score of 2.  Continue Apixaban (Eliquis).   3. HTN Stable, no changes today.   Follow up with Dr Domenic Polite as scheduled. AF clinic in 6 months.    [facility] [address] [place], [place] [address] [phone] 03/16/2021 8:49 AM

## 2021-04-10 ENCOUNTER — Encounter: Payer: Self-pay | Admitting: Gastroenterology

## 2021-04-10 NOTE — Progress Notes (Signed)
Referring Provider:Nicholson, Anabel Bene, FNP Primary Care Physician:  Wannetta Sender, FNP Primary Gastroenterologist:  Dr. Gala Romney  Chief Complaint  Patient presents with   consult tcs    Last had TCS 5 years ago. Hx polyps     HPI:   Pedro Gibbs is a 65 y.o. male presenting today at the request of Wannetta Sender, FNP for consult colonoscopy.  Office visit due to Eliquis.  Last colonoscopy June 2011 with Dr. Laural Golden: 2 small diverticula in sigmoid colon, one 57mm hyperplastic rectal polyp ablated via cold biopsy, small external hemorrhoids. Recommended repeat exam in 5 years due to family history.   Today:  Reports his last colonoscopy was 5 years ago in [place] with small polyps.  He has no GI concerns today.  Denies abdominal pain, constipation, diarrhea, BRBPR, melena, unintentional weight loss, nausea, vomiting, GERD symptoms, or dysphagia.  Had been having some trouble with atrial fibrillation, but current medications have his symptoms well controlled.  Recently retired.  Walks at least a mile 3 times a week.  Past Medical History:  Diagnosis Date   Anemia    Essential hypertension    Mixed hyperlipidemia    Paroxysmal atrial fibrillation [facility])     Past Surgical History:  Procedure Laterality Date   BACK SURGERY     COLONOSCOPY  11/20/2009   Dr. Laural Golden; 2 small diverticula in sigmoid colon, one 3mm hyperplastic rectal polyp ablated via cold biopsy, small external hemorrhoids. Recommended repeat exam in 5 years due to family history.   COLONOSCOPY  2017   Per patient: Colonoscopy in [place] in 2017 with small polyps.   SHOULDER ARTHROSCOPY Right     Current Outpatient Medications  Medication Sig Dispense Refill   allopurinol (ZYLOPRIM) 300 MG tablet Take 300 mg by mouth daily as needed.      apixaban (ELIQUIS) 5 MG TABS tablet Take 1 tablet (5 mg total) by mouth 2 (two) times daily. 60 tablet 6   diphenhydrAMINE-APAP, sleep, (TYLENOL PM EXTRA  STRENGTH PO) Take 1 tablet by mouth daily.     flecainide (TAMBOCOR) 50 MG tablet TAKE (1) TABLET TWICE DAILY. 60 tablet 6   lisinopril (ZESTRIL) 20 MG tablet Take 20 mg by mouth daily.     metoprolol succinate (TOPROL XL) 50 MG 24 hr tablet Take 1 tablet (50 mg total) by mouth 2 (two) times daily. Take with or immediately following a meal. 60 tablet 6   Omega-3 1000 MG CAPS Take 1 capsule by mouth daily.     rosuvastatin (CRESTOR) 10 MG tablet Take 10 mg by mouth daily.     sildenafil (REVATIO) 20 MG tablet Take 40 mg by mouth as needed.     No current facility-administered medications for this visit.    Allergies as of 04/12/2021 - Review Complete 04/12/2021  Allergen Reaction Noted   Asa [aspirin] Nausea Only 05/10/2016    Family History  Problem Relation Age of Onset   Stroke Father    Colon cancer Brother        26s   Throat cancer Brother    Colon cancer Paternal Uncle        11s   Throat cancer Paternal Uncle     Social History   Socioeconomic History   Marital status: Widowed    Spouse name: Not on file   Number of children: Not on file   Years of education: Not on file   Highest education level: Not on file  Occupational History   Not on file  Tobacco Use   Smoking status: Former    Types: Cigarettes    Quit date: 2005    Years since quitting: 17.8   Smokeless tobacco: Never   Tobacco comments:    Former smoker 03/16/2021  Vaping Use   Vaping Use: Never used  Substance and Sexual Activity   Alcohol use: Yes    Alcohol/week: 3.0 standard drinks    Types: 3 Cans of beer per week    Comment: rare    Drug use: Never   Sexual activity: Not on file  Other Topics Concern   Not on file  Social History Narrative   Not on file   Social Determinants of Health   Financial Resource Strain: Not on file  Food Insecurity: Not on file  Transportation Needs: Not on file  Physical Activity: Not on file  Stress: Not on file  Social Connections: Not on file   Intimate Partner Violence: Not on file    Review of Systems: Gen: Denies any fever, chills, cold or flulike symptoms, presyncope, syncope. CV: Denies chest pain, heart palpitations. Resp: Denies shortness of breath or cough. GI: See HPI GU : Denies urinary burning, urinary frequency, urinary hesitancy MS: Denies joint pain. Derm: Denies rash. Psych: Denies depression, anxiety.  Heme: See HPI  Physical Exam: BP (!) 148/81   Pulse 80   Temp (!) 97.3 F (36.3 C)   Ht 6' (1.829 m)   Wt 174 lb 6.4 oz (79.1 kg)   BMI 23.65 kg/m  General:   Alert and oriented. Pleasant and cooperative. Well-nourished and well-developed.  Head:  Normocephalic and atraumatic. Eyes:  Without icterus, sclera clear and conjunctiva pink.  Ears:  Normal auditory acuity. Lungs:  Clear to auscultation bilaterally. No wheezes, rales, or rhonchi. No distress.  Heart:  S1, S2 present without murmurs appreciated.  Abdomen:  +BS, soft, non-tender and non-distended. No HSM noted. No guarding or rebound. No masses appreciated.  Rectal:  Deferred  Msk:  Symmetrical without gross deformities. Normal posture. Extremities:  Without edema. Neurologic:  Alert and  oriented x4;  grossly normal neurologically. Skin:  Intact without significant lesions or rashes. Psych: Normal mood and affect.    Assessment: 65 year old male with history of paroxysmal atrial fibrillation on Eliquis, HTN, HLD, colon polyps, presenting today to discuss scheduling surveillance colonoscopy.  Last colonoscopy in our system in 2011 with 1 hyperplastic rectal polyp.  Patient reports his last colonoscopy was 5 years ago in [place] with small polyps.  He also has family history significant for a brother with colon cancer at age 83 and paternal uncle with colon cancer as well.  Currently without any significant GI symptoms.  No alarm symptoms.   Plan: Proceed with colonoscopy with propofol with Dr. Gala Romney in the near future. The risks, benefits, and  alternatives have been discussed with the patient in detail. The patient states understanding and desires to proceed. ASA 2 We will get approval from cardiology to hold Eliquis x48 hours prior to procedure. [facility]oscopy [facility].  Follow-up as needed    Nevin Bloodgood [facility] 04/12/2021

## 2021-04-10 NOTE — H&P (View-Only) (Signed)
Referring Provider:Nicholson, Anabel Bene, FNP Primary Care Physician:  Wannetta Sender, FNP Primary Gastroenterologist:  Dr. Gala Romney  Chief Complaint  Patient presents with   consult tcs    Last had TCS 5 years ago. Hx polyps     HPI:   Pedro Gibbs is a 65 y.o. male presenting today at the request of Wannetta Sender, FNP for consult colonoscopy.  Office visit due to Eliquis.  Last colonoscopy June 2011 with Dr. Laural Golden: 2 small diverticula in sigmoid colon, one 64mm hyperplastic rectal polyp ablated via cold biopsy, small external hemorrhoids. Recommended repeat exam in 5 years due to family history.   Today:  Reports his last colonoscopy was 5 years ago in [place] with small polyps.  He has no GI concerns today.  Denies abdominal pain, constipation, diarrhea, BRBPR, melena, unintentional weight loss, nausea, vomiting, GERD symptoms, or dysphagia.  Had been having some trouble with atrial fibrillation, but current medications have his symptoms well controlled.  Recently retired.  Walks at least a mile 3 times a week.  Past Medical History:  Diagnosis Date   Anemia    Essential hypertension    Mixed hyperlipidemia    Paroxysmal atrial fibrillation [facility])     Past Surgical History:  Procedure Laterality Date   BACK SURGERY     COLONOSCOPY  11/20/2009   Dr. Laural Golden; 2 small diverticula in sigmoid colon, one 43mm hyperplastic rectal polyp ablated via cold biopsy, small external hemorrhoids. Recommended repeat exam in 5 years due to family history.   COLONOSCOPY  2017   Per patient: Colonoscopy in [place] in 2017 with small polyps.   SHOULDER ARTHROSCOPY Right     Current Outpatient Medications  Medication Sig Dispense Refill   allopurinol (ZYLOPRIM) 300 MG tablet Take 300 mg by mouth daily as needed.      apixaban (ELIQUIS) 5 MG TABS tablet Take 1 tablet (5 mg total) by mouth 2 (two) times daily. 60 tablet 6   diphenhydrAMINE-APAP, sleep, (TYLENOL PM EXTRA  STRENGTH PO) Take 1 tablet by mouth daily.     flecainide (TAMBOCOR) 50 MG tablet TAKE (1) TABLET TWICE DAILY. 60 tablet 6   lisinopril (ZESTRIL) 20 MG tablet Take 20 mg by mouth daily.     metoprolol succinate (TOPROL XL) 50 MG 24 hr tablet Take 1 tablet (50 mg total) by mouth 2 (two) times daily. Take with or immediately following a meal. 60 tablet 6   Omega-3 1000 MG CAPS Take 1 capsule by mouth daily.     rosuvastatin (CRESTOR) 10 MG tablet Take 10 mg by mouth daily.     sildenafil (REVATIO) 20 MG tablet Take 40 mg by mouth as needed.     No current facility-administered medications for this visit.    Allergies as of 04/12/2021 - Review Complete 04/12/2021  Allergen Reaction Noted   Asa [aspirin] Nausea Only 05/10/2016    Family History  Problem Relation Age of Onset   Stroke Father    Colon cancer Brother        50s   Throat cancer Brother    Colon cancer Paternal Uncle        40s   Throat cancer Paternal Uncle     Social History   Socioeconomic History   Marital status: Widowed    Spouse name: Not on file   Number of children: Not on file   Years of education: Not on file   Highest education level: Not on file  Occupational History   Not on file  Tobacco Use   Smoking status: Former    Types: Cigarettes    Quit date: 2005    Years since quitting: 17.8   Smokeless tobacco: Never   Tobacco comments:    Former smoker 03/16/2021  Vaping Use   Vaping Use: Never used  Substance and Sexual Activity   Alcohol use: Yes    Alcohol/week: 3.0 standard drinks    Types: 3 Cans of beer per week    Comment: rare    Drug use: Never   Sexual activity: Not on file  Other Topics Concern   Not on file  Social History Narrative   Not on file   Social Determinants of Health   Financial Resource Strain: Not on file  Food Insecurity: Not on file  Transportation Needs: Not on file  Physical Activity: Not on file  Stress: Not on file  Social Connections: Not on file   Intimate Partner Violence: Not on file    Review of Systems: Gen: Denies any fever, chills, cold or flulike symptoms, presyncope, syncope. CV: Denies chest pain, heart palpitations. Resp: Denies shortness of breath or cough. GI: See HPI GU : Denies urinary burning, urinary frequency, urinary hesitancy MS: Denies joint pain. Derm: Denies rash. Psych: Denies depression, anxiety.  Heme: See HPI  Physical Exam: BP (!) 148/81   Pulse 80   Temp (!) 97.3 F (36.3 C)   Ht 6' (1.829 m)   Wt 174 lb 6.4 oz (79.1 kg)   BMI 23.65 kg/m  General:   Alert and oriented. Pleasant and cooperative. Well-nourished and well-developed.  Head:  Normocephalic and atraumatic. Eyes:  Without icterus, sclera clear and conjunctiva pink.  Ears:  Normal auditory acuity. Lungs:  Clear to auscultation bilaterally. No wheezes, rales, or rhonchi. No distress.  Heart:  S1, S2 present without murmurs appreciated.  Abdomen:  +BS, soft, non-tender and non-distended. No HSM noted. No guarding or rebound. No masses appreciated.  Rectal:  Deferred  Msk:  Symmetrical without gross deformities. Normal posture. Extremities:  Without edema. Neurologic:  Alert and  oriented x4;  grossly normal neurologically. Skin:  Intact without significant lesions or rashes. Psych: Normal mood and affect.    Assessment: 65 year old male with history of paroxysmal atrial fibrillation on Eliquis, HTN, HLD, colon polyps, presenting today to discuss scheduling surveillance colonoscopy.  Last colonoscopy in our system in 2011 with 1 hyperplastic rectal polyp.  Patient reports his last colonoscopy was 5 years ago in [place] with small polyps.  He also has family history significant for a brother with colon cancer at age 68 and paternal uncle with colon cancer as well.  Currently without any significant GI symptoms.  No alarm symptoms.   Plan: Proceed with colonoscopy with propofol with Dr. Gala Romney in the near future. The risks, benefits, and  alternatives have been discussed with the patient in detail. The patient states understanding and desires to proceed. ASA 2 We will get approval from cardiology to hold Eliquis x48 hours prior to procedure. Request colonoscopy records from [facility].  Follow-up as needed    Nevin Bloodgood [facility] 04/12/2021

## 2021-04-12 ENCOUNTER — Other Ambulatory Visit: Payer: Self-pay

## 2021-04-12 ENCOUNTER — Encounter: Payer: Self-pay | Admitting: Gastroenterology

## 2021-04-12 ENCOUNTER — Ambulatory Visit: Payer: Medicare HMO | Admitting: Gastroenterology

## 2021-04-12 VITALS — BP 148/81 | HR 80 | Temp 97.3°F | Ht 72.0 in | Wt 174.4 lb

## 2021-04-12 DIAGNOSIS — Z800 Family history of malignant neoplasm of digestive organs: Secondary | ICD-10-CM | POA: Insufficient documentation

## 2021-04-12 DIAGNOSIS — Z86010 Personal history of colonic polyps: Secondary | ICD-10-CM | POA: Diagnosis not present

## 2021-04-12 NOTE — Patient Instructions (Signed)
We will arrange for you to have a colonoscopy in the near future with Dr. Gala Romney. We will reach out to your cardiologist to get the okay to hold Eliquis for 48 hours prior to your procedure.  We will plan to see you back in the office as Dr. Gala Romney recommends.  Do not hesitate to call if you have any new GI concerns.   It was a pleasure meeting you today! Have a Happy Thanksgiving!   Aliene Altes, PA-C [facility]

## 2021-04-13 ENCOUNTER — Telehealth: Payer: Self-pay | Admitting: *Deleted

## 2021-04-13 ENCOUNTER — Telehealth: Payer: Self-pay

## 2021-04-13 NOTE — Telephone Encounter (Signed)
Faxed to [facility] Pre-op letter for clearance for pt to hold Eliquis 48 hrs prior to colonoscopy. Waiting on a response.

## 2021-04-13 NOTE — Telephone Encounter (Signed)
Patient with diagnosis of atrial fibrillation on Eliquis for anticoagulation.    Procedure: colonoscopy Date of procedure: TBD   CHA2DS2-VASc Score = 2   This indicates a 2.2% annual risk of stroke. The patient's score is based upon: CHF History: 0 HTN History: 1 Diabetes History: 0 Stroke History: 0 Vascular Disease History: 0 Age Score: 1 Gender Score: 0  CrCl 100  Platelet count 235  Per office protocol, patient can hold Eliquis for 2 days prior to procedure.   Patient will not need bridging with Lovenox (enoxaparin) around procedure.

## 2021-04-13 NOTE — Telephone Encounter (Signed)
Clinical pharmacist to review Eliquis 

## 2021-04-13 NOTE — Telephone Encounter (Signed)
   [facility] Pre-operative Risk Assessment    Patient Name: Pedro Gibbs  DOB: 07-17-1955 MRN: [id number]  HEARTCARE STAFF:  - IMPORTANT!!!!!! Under Visit Info/Reason for Call, type in Other and utilize the format Clearance MM/DD/YY or Clearance TBD. Do not use dashes or single digits. - Please review there is not already an duplicate clearance open for this procedure. - If request is for dental extraction, please clarify the # of teeth to be extracted. - If the patient is currently at the dentist's office, call Pre-Op Callback Staff (MA/nurse) to input urgent request.  - If the patient is not currently in the dentist office, please route to the Pre-Op pool.  Request for surgical clearance:  What type of surgery is being performed?  COLONOSCOPY   When is this surgery scheduled?  TBD  What type of clearance is required (medical clearance vs. Pharmacy clearance to hold med vs. Both)?  PHARMACY  Are there any medications that need to be held prior to surgery and how long?  ELIQUIS X'S 22 HOURS  Practice name and name of physician performing surgery?   [facility]   What is the office phone number?  [phone]   7.   What is the office fax number?  [phone]  8.   Anesthesia type (None, local, MAC, general) ?  PROPOFOL    Jeanann Lewandowsky 04/13/2021, 10:31 AM  _________________________________________________________________   (provider comments below)

## 2021-04-14 ENCOUNTER — Other Ambulatory Visit: Payer: Self-pay

## 2021-04-14 ENCOUNTER — Telehealth: Payer: Self-pay | Admitting: Gastroenterology

## 2021-04-14 ENCOUNTER — Encounter: Payer: Self-pay | Admitting: Gastroenterology

## 2021-04-14 NOTE — Telephone Encounter (Signed)
Pt can be scheduled to have his colonoscopy. Documentation given to Dr. Abbey Chatters

## 2021-04-14 NOTE — Telephone Encounter (Signed)
Received and reviewed colonoscopy report from [facility].  Exam date: 11/26/2014.  Endoscopist: Dr. Doristine Mango.  Impression: Mild diverticulosis in the descending colon and sigmoid colon.  Small flat polyp in the rectum; polypectomy was performed using snare cautery.  Retroflexed views revealed no abnormalities.  Recommendations: Repeat in 5 years.  Pathology: None received.   I have updated patient's history, and we will have report scanned into patient's chart.

## 2021-04-14 NOTE — Telephone Encounter (Signed)
    Patient Name: Pedro Gibbs  DOB: 1955/12/03 MRN: [id number]  Primary Cardiologist: Rozann Lesches, MD  Chart reviewed as part of pre-operative protocol coverage. Patient has upcoming colonoscopy planned and we were asked to give our recommendations for holding Eliquis. Per Pharmacy and office protocol, "Patient can hold Eliquis for 2 days prior to procedure. Patient will not need bridging with Lovenox (enoxaparin) around procedure." Please restart this as soon as able following colonoscopy.   I will route this recommendation to the requesting party via Epic fax function and remove from pre-op pool.  Please call with questions.  Darreld Mclean, PA-C 04/14/2021, 10:34 AM

## 2021-04-14 NOTE — Telephone Encounter (Signed)
Tried to call pt, LMOAM for return call.

## 2021-04-14 NOTE — Telephone Encounter (Signed)
Spoke to Pedro Gibbs this afternoon, TCS scheduled for 05/11/21 at 9:30am. Pedro Gibbs advised to hold Eliquis for 48 hours before procedure.

## 2021-04-15 ENCOUNTER — Other Ambulatory Visit: Payer: Self-pay

## 2021-04-15 MED ORDER — PEG 3350-KCL-NA BICARB-NACL 420 G PO SOLR: 4000.0000 mL | ORAL | 0 refills | Status: DC

## 2021-04-15 NOTE — Telephone Encounter (Signed)
FYI to [employer] PA, ok received for pt to hold Eliquis for 48 hours prior to procedure.

## 2021-04-15 NOTE — Telephone Encounter (Signed)
Noted  

## 2021-04-15 NOTE — Telephone Encounter (Signed)
TCS was scheduled w/Dr. Amedeo Plenty ok w/Dr. Abbey Chatters. He was unable to do dates available for Dr. Gala Romney in December. Aliene Altes PA was informed. Dr. Abbey Chatters and Dr. Gala Romney agreeable. Rx for prep sent to pharmacy. Orders entered.

## 2021-05-03 ENCOUNTER — Other Ambulatory Visit (HOSPITAL_COMMUNITY): Payer: Self-pay | Admitting: Physician Assistant

## 2021-05-11 ENCOUNTER — Other Ambulatory Visit: Payer: Self-pay

## 2021-05-11 ENCOUNTER — Ambulatory Visit (HOSPITAL_COMMUNITY)
Admission: RE | Admit: 2021-05-11 | Discharge: 2021-05-11 | Disposition: A | Discharge status: Home / Self Care | Payer: Medicare HMO | Attending: Internal Medicine | Admitting: Internal Medicine

## 2021-05-11 ENCOUNTER — Encounter (HOSPITAL_COMMUNITY): Payer: Self-pay

## 2021-05-11 ENCOUNTER — Encounter (HOSPITAL_COMMUNITY)
Admission: RE | Disposition: A | Discharge status: Home / Self Care | Payer: Self-pay | Source: Home / Self Care | Attending: Internal Medicine

## 2021-05-11 ENCOUNTER — Ambulatory Visit (HOSPITAL_COMMUNITY): Payer: Medicare HMO | Admitting: Anesthesiology

## 2021-05-11 DIAGNOSIS — Z86010 Personal history of colonic polyps: Secondary | ICD-10-CM | POA: Insufficient documentation

## 2021-05-11 DIAGNOSIS — Z1211 Encounter for screening for malignant neoplasm of colon: Secondary | ICD-10-CM | POA: Diagnosis present

## 2021-05-11 DIAGNOSIS — E782 Mixed hyperlipidemia: Secondary | ICD-10-CM | POA: Insufficient documentation

## 2021-05-11 DIAGNOSIS — Z800 Family history of malignant neoplasm of digestive organs: Secondary | ICD-10-CM | POA: Insufficient documentation

## 2021-05-11 DIAGNOSIS — I480 Paroxysmal atrial fibrillation: Secondary | ICD-10-CM | POA: Diagnosis not present

## 2021-05-11 DIAGNOSIS — I10 Essential (primary) hypertension: Secondary | ICD-10-CM | POA: Insufficient documentation

## 2021-05-11 DIAGNOSIS — Z87891 Personal history of nicotine dependence: Secondary | ICD-10-CM | POA: Insufficient documentation

## 2021-05-11 DIAGNOSIS — K648 Other hemorrhoids: Secondary | ICD-10-CM | POA: Diagnosis not present

## 2021-05-11 DIAGNOSIS — Z7901 Long term (current) use of anticoagulants: Secondary | ICD-10-CM | POA: Insufficient documentation

## 2021-05-11 HISTORY — PX: COLONOSCOPY WITH PROPOFOL: SHX5780

## 2021-05-11 SURGERY — COLONOSCOPY WITH PROPOFOL
Anesthesia: General

## 2021-05-11 MED ORDER — PROPOFOL 10 MG/ML IV BOLUS
INTRAVENOUS | Status: DC | PRN
  Administered 2021-05-11: 150 ug/kg/min via INTRAVENOUS
  Administered 2021-05-11: 100 mg via INTRAVENOUS

## 2021-05-11 MED ORDER — LACTATED RINGERS IV SOLN: INTRAVENOUS | Status: DC

## 2021-05-11 NOTE — Anesthesia Preprocedure Evaluation (Addendum)
Anesthesia Evaluation  Patient identified by MRN, date of birth, ID band Patient awake    Reviewed: Allergy & Precautions, H&P , NPO status , Patient's Chart, lab work & pertinent test results, reviewed documented beta blocker date and time   Airway Mallampati: III  TM Distance: >3 FB Neck ROM: Full    Dental  (+) Upper Dentures, Lower Dentures   Pulmonary former smoker,    Pulmonary exam normal breath sounds clear to auscultation       Cardiovascular Exercise Tolerance: Good hypertension, Pt. on home beta blockers and Pt. on medications Normal cardiovascular exam+ dysrhythmias Atrial Fibrillation  Rhythm:Regular Rate:Normal     Neuro/Psych negative neurological ROS  negative psych ROS   GI/Hepatic negative GI ROS, (+)     substance abuse  alcohol use,   Endo/Other  negative endocrine ROS  Renal/GU negative Renal ROS  negative genitourinary   Musculoskeletal negative musculoskeletal ROS (+)   Abdominal   Peds negative pediatric ROS (+)  Hematology negative hematology ROS (+) anemia ,   Anesthesia Other Findings   Reproductive/Obstetrics negative OB ROS                            Anesthesia Physical Anesthesia Plan  ASA: 2  Anesthesia Plan: General   Post-op Pain Management: Minimal or no pain anticipated   Induction:   PONV Risk Score and Plan: TIVA  Airway Management Planned: Nasal Cannula and Natural Airway  Additional Equipment:   Intra-op Plan:   Post-operative Plan:   Informed Consent: I have reviewed the patients History and Physical, chart, labs and discussed the procedure including the risks, benefits and alternatives for the proposed anesthesia with the patient or authorized representative who has indicated his/her understanding and acceptance.     Dental advisory given  Plan Discussed with: CRNA and Surgeon  Anesthesia Plan Comments:          Anesthesia Quick Evaluation

## 2021-05-11 NOTE — Transfer of Care (Signed)
Immediate Anesthesia Transfer of Care Note  Patient: Pedro Gibbs  Procedure(s) Performed: COLONOSCOPY WITH PROPOFOL  Patient Location: Endoscopy Unit  Anesthesia Type:General  Level of Consciousness: drowsy  Airway & Oxygen Therapy: Patient Spontanous Breathing  Post-op Assessment: Report given to RN and Post -op Vital signs reviewed and stable  Post vital signs: Reviewed and stable  Last Vitals:  Vitals Value Taken Time  BP    Temp    Pulse    Resp    SpO2      Last Pain:  Vitals:   05/11/21 0941  TempSrc:   PainSc: 0-No pain      Patients Stated Pain Goal: 7 (42/55/25 8948)  Complications: No notable events documented.

## 2021-05-11 NOTE — Discharge Instructions (Addendum)
  Colonoscopy Discharge Instructions  Read the instructions outlined below and refer to this sheet in the next few weeks. These discharge instructions provide you with general information on caring for yourself after you leave the hospital. Your doctor may also give you specific instructions. While your treatment has been planned according to the most current medical practices available, unavoidable complications occasionally occur.   ACTIVITY You may resume your regular activity, but move at a slower pace for the next 24 hours.  Take frequent rest periods for the next 24 hours.  Walking will help get rid of the air and reduce the bloated feeling in your belly (abdomen).  No driving for 24 hours (because of the medicine (anesthesia) used during the test).   Do not sign any important legal documents or operate any machinery for 24 hours (because of the anesthesia used during the test).  NUTRITION Drink plenty of fluids.  You may resume your normal diet as instructed by your doctor.  Begin with a light meal and progress to your normal diet. Heavy or fried foods are harder to digest and may make you feel sick to your stomach (nauseated).  Avoid alcoholic beverages for 24 hours or as instructed.  MEDICATIONS You may resume your normal medications unless your doctor tells you otherwise.  WHAT YOU CAN EXPECT TODAY Some feelings of bloating in the abdomen.  Passage of more gas than usual.  Spotting of blood in your stool or on the toilet paper.  IF YOU HAD POLYPS REMOVED DURING THE COLONOSCOPY: No aspirin products for 7 days or as instructed.  No alcohol for 7 days or as instructed.  Eat a soft diet for the next 24 hours.  FINDING OUT THE RESULTS OF YOUR TEST Not all test results are available during your visit. If your test results are not back during the visit, make an appointment with your caregiver to find out the results. Do not assume everything is normal if you have not heard from your  caregiver or the medical facility. It is important for you to follow up on all of your test results.  SEEK IMMEDIATE MEDICAL ATTENTION IF: You have more than a spotting of blood in your stool.  Your belly is swollen (abdominal distention).  You are nauseated or vomiting.  You have a temperature over 101.  You have abdominal pain or discomfort that is severe or gets worse throughout the day.   Your colonoscopy was relatively unremarkable.  I did not find any polyps or evidence of colon cancer. We will plan on repeat colonoscopy in 5 years. Otherwise follow-up with GI as needed. Resume eliquis tonight.   I hope you have a great rest of your week!  Elon Alas. Abbey Chatters, D.O. Gastroenterology and Hepatology [facility]

## 2021-05-11 NOTE — Interval H&P Note (Signed)
History and Physical Interval Note:  05/11/2021 9:32 AM  Pedro Gibbs  has presented today for surgery, with the diagnosis of history of colon polyps, family history of colon cancer.  The various methods of treatment have been discussed with the patient and family. After consideration of risks, benefits and other options for treatment, the patient has consented to  Procedure(s) with comments: COLONOSCOPY WITH PROPOFOL (N/A) - 9:30am as a surgical intervention.  The patient's history has been reviewed, patient examined, no change in status, stable for surgery.  I have reviewed the patient's chart and labs.  Questions were answered to the patient's satisfaction.     Pedro Gibbs

## 2021-05-11 NOTE — Op Note (Signed)
[facility] Patient Name: Pedro Gibbs Procedure Date: 05/11/2021 9:28 AM MRN: [id number] Date of Birth: September 01, 1955 Attending MD: Elon Alas. Abbey Chatters DO CSN: [id number] Age: 65 Admit Type: Outpatient Procedure:                Colonoscopy Indications:              Colon cancer screening in patient at increased                            risk: Colorectal cancer in brother, High risk colon                            cancer surveillance: Personal history of colonic                            polyps Providers:                Elon Alas. Abbey Chatters, DO, Charlsie Quest. Theda Sers RN, RN,                            Nelma Rothman, Technician Referring MD:              Medicines:                See the Anesthesia note for documentation of the                            administered medications Complications:            No immediate complications. Estimated Blood Loss:     Estimated blood loss: none. Procedure:                Pre-Anesthesia Assessment:                           - The anesthesia plan was to use monitored                            anesthesia care (MAC).                           After obtaining informed consent, the colonoscope                            was passed under direct vision. Throughout the                            procedure, the patient's blood pressure, pulse, and                            oxygen saturations were monitored continuously. The                            [id number]-HQ190L ([id number]) scope was introduced through                            the anus and advanced to the the cecum, identified  by appendiceal orifice and ileocecal valve. The                            colonoscopy was performed without difficulty. The                            patient tolerated the procedure well. The quality                            of the bowel preparation was evaluated using the                            BBPS [facility] Bowel Preparation Scale) with scores                             of: Right Colon = 2 (minor amount of residual                            staining, small fragments of stool and/or opaque                            liquid, but mucosa seen well), Transverse Colon = 2                            (minor amount of residual staining, small fragments                            of stool and/or opaque liquid, but mucosa seen                            well) and Left Colon = 2 (minor amount of residual                            staining, small fragments of stool and/or opaque                            liquid, but mucosa seen well). The total BBPS score                            equals 6. The quality of the bowel preparation was                            fair. Scope In: 9:41:34 AM Scope Out: 9:51:32 AM Scope Withdrawal Time: 0 hours 6 minutes 39 seconds  Total Procedure Duration: 0 hours 9 minutes 58 seconds  Findings:      The perianal and digital rectal examinations were normal.      Non-bleeding internal hemorrhoids were found during endoscopy.      The exam was otherwise without abnormality. Impression:               - Preparation of the colon was fair.                           -  Non-bleeding internal hemorrhoids.                           - The examination was otherwise normal.                           - No specimens collected. Moderate Sedation:      Per Anesthesia Care Recommendation:           - Patient has a contact number available for                            emergencies. The signs and symptoms of potential                            delayed complications were discussed with the                            patient. Return to normal activities tomorrow.                            Written discharge instructions were provided to the                            patient.                           - Resume previous diet.                           - Continue present medications.                           - Repeat colonoscopy in 5  years for high risk                            screening purposes due to family history of colon                            cancer in brother.                           - Return to GI clinic PRN. Procedure Code(s):        --- Professional ---                           A1937, Colorectal cancer screening; colonoscopy on                            individual at high risk Diagnosis Code(s):        --- Professional ---                           Z80.0, Family history of malignant neoplasm of                            digestive organs  Z86.010, Personal history of colonic polyps                           K64.8, Other hemorrhoids CPT copyright 2019 American Medical Association. All rights reserved. The codes documented in this report are preliminary and upon coder review may  be revised to meet current compliance requirements. Elon Alas. Abbey Chatters, DO [place] Abbey Chatters, DO 05/11/2021 9:54:50 AM This report has been signed electronically. Number of Addenda: 0

## 2021-05-11 NOTE — Anesthesia Postprocedure Evaluation (Signed)
Anesthesia Post Note  Patient: Pedro Gibbs  Procedure(s) Performed: COLONOSCOPY WITH PROPOFOL  Patient location during evaluation: Endoscopy Anesthesia Type: General Level of consciousness: awake and alert and oriented Pain management: pain level controlled Vital Signs Assessment: post-procedure vital signs reviewed and stable Respiratory status: spontaneous breathing, nonlabored ventilation and respiratory function stable Cardiovascular status: blood pressure returned to baseline and stable Postop Assessment: no apparent nausea or vomiting Anesthetic complications: no   No notable events documented.   Last Vitals:  Vitals:   05/11/21 0850 05/11/21 0954  BP: (!) 176/81 (!) 108/56  Pulse: (!) 59 (!) 55  Resp: 19 20  Temp: 36.5 C 36.8 C  SpO2: 99% 97%    Last Pain:  Vitals:   05/11/21 0954  TempSrc: Oral  PainSc: 0-No pain                 Sherian Valenza C Jackline Castilla

## 2021-05-14 ENCOUNTER — Encounter (HOSPITAL_COMMUNITY): Payer: Self-pay | Admitting: Internal Medicine

## 2021-05-19 ENCOUNTER — Other Ambulatory Visit (HOSPITAL_COMMUNITY): Payer: Self-pay | Admitting: Physician Assistant

## 2021-06-15 NOTE — Progress Notes (Signed)
Cardiology Office Note  Date: 06/16/2021   ID: Pedro, Gibbs 1956-03-01, MRN [id number]  PCP:  Wannetta Sender, FNP  Cardiologist:  Rozann Lesches, MD Electrophysiologist:  None   Chief Complaint  Patient presents with   Cardiac follow-up    History of Present Illness: Pedro Gibbs is a 66 y.o. male last seen in the atrial fibrillation clinic in October 2022, I reviewed the note.  He is here for a routine visit.  He does not report any interval palpitations or definitive breakthrough atrial fibrillation.  I reviewed his medications which are stable and outlined below.  ECG today shows normal sinus rhythm with normal intervals.  He had lab work in December 2022 as well, hemoglobin and renal function normal.  He does not report any spontaneous bleeding problems on Eliquis.  Past Medical History:  Diagnosis Date   Anemia    Essential hypertension    Mixed hyperlipidemia    Paroxysmal atrial fibrillation [facility])     Past Surgical History:  Procedure Laterality Date   BACK SURGERY     COLONOSCOPY  11/20/2009   Dr. Laural Golden; 2 small diverticula in sigmoid colon, one 27mm hyperplastic rectal polyp ablated via cold biopsy, small external hemorrhoids. Recommended repeat exam in 5 years due to family history.   COLONOSCOPY  11/26/2014   Dr. Britta Mccreedy; mild diverticulosis in descending and sigmoid colon, small flat polyp in rectum s/p polypectomy.  No pathology received.  Recommended repeat in 5 years.   COLONOSCOPY WITH PROPOFOL N/A 05/11/2021   Procedure: COLONOSCOPY WITH PROPOFOL;  Surgeon: [name], DO;  [facility];  Service: Endoscopy;  Laterality: N/A;  9:30am   SHOULDER ARTHROSCOPY Right     Current Outpatient Medications  Medication Sig Dispense Refill   acetaminophen (TYLENOL) 500 MG tablet Take 1,000 mg by mouth every 8 (eight) hours as needed for moderate pain.     allopurinol (ZYLOPRIM) 300 MG tablet Take 300 mg by mouth daily as needed  (gout).     diphenhydramine-acetaminophen (TYLENOL PM) 25-500 MG TABS tablet Take 2 tablets by mouth at bedtime as needed (sleep).     ELIQUIS 5 MG TABS tablet TAKE (1) TABLET TWICE DAILY. 60 tablet 6   flecainide (TAMBOCOR) 50 MG tablet TAKE (1) TABLET TWICE DAILY. 60 tablet 6   lisinopril (ZESTRIL) 20 MG tablet Take 20 mg by mouth daily.     metoprolol succinate (TOPROL-XL) 50 MG 24 hr tablet TAKE 1 TABLET TWICE DAILY IMMEDIATELY FOLLOWING A MEAL. 60 tablet 6   Omega-3 1000 MG CAPS Take 1,000 mg by mouth daily.     rosuvastatin (CRESTOR) 10 MG tablet Take 10 mg by mouth daily.     sildenafil (REVATIO) 20 MG tablet Take 40 mg by mouth daily as needed (ED).     tetrahydrozoline-zinc (VISINE-AC) 0.05-0.25 % ophthalmic solution Place 2 drops into both eyes 3 (three) times daily as needed.     No current facility-administered medications for this visit.   Allergies:  Asa [aspirin]   ROS: No dizziness or syncope.  Physical Exam: VS:  BP (!) 144/82    Pulse 64    Ht 6' (1.829 m)    Wt 172 lb 4.8 oz (78.2 kg)    SpO2 97%    BMI 23.37 kg/m , BMI Body mass index is 23.37 kg/m.  Wt Readings from Last 3 Encounters:  06/16/21 172 lb 4.8 oz (78.2 kg)  05/11/21 174 lb (78.9 kg)  04/12/21  174 lb 6.4 oz (79.1 kg)    General: Patient appears comfortable at rest. HEENT: Conjunctiva and lids normal, wearing a mask. Neck: Supple, no elevated JVP or carotid bruits, no thyromegaly. Lungs: Clear to auscultation, nonlabored breathing at rest. Cardiac: Regular rate and rhythm, no S3 or significant systolic murmur. Extremities: No pitting edema.  ECG:  An ECG dated 03/16/2021 was personally reviewed today and demonstrated:  Sinus rhythm.  Recent Labwork: 07/25/2020: BUN 13; Creatinine, Ser 0.81; Hemoglobin 13.8; Magnesium 1.8; Platelets 235; Potassium 3.6; Sodium 139  December 2022: Hemoglobin 13.8, platelets 253, BUN 12, creatinine 0.93, potassium 4.4, AST 58 ALT 41, cholesterol 205, triglycerides 214,  HDL 105, LDL 67  Other Studies Reviewed Today:  Echocardiogram 08/28/2020:  1. Left ventricular ejection fraction, by estimation, is 60 to 65%. The  left ventricle has normal function. The left ventricle has no regional  wall motion abnormalities. Left ventricular diastolic parameters were  normal.   2. Right ventricular systolic function is normal. The right ventricular  size is normal.   3. The mitral valve is normal in structure. Mild mitral valve  regurgitation.   4. The aortic valve is tricuspid. Aortic valve regurgitation is not  visualized. Mild aortic valve sclerosis is present, with no evidence of  aortic valve stenosis.   5. The inferior vena cava is normal in size with greater than 50%  respiratory variability, suggesting right atrial pressure of 3 mmHg.    Cardiac monitor March 2022: ZIO XT reviewed.  10 days 16 hours analyzed.  Predominant rhythm is sinus with heart rate ranging from 43 bpm up to 119 bpm and average heart rate 58 bpm.  Rare PACs were noted including couplets and triplets representing less than 1% total beats.  There were also rare PVCs noted including couplets representing less than 1% total beats.  Atypical atrial flutter and possibly some atrial fibrillation was also noted, representing 2% rhythm burden.  Longest episode of atypical atrial flutter was approximately 12 minutes.  There were no pauses.   GXT 11/05/2020: Blood pressure demonstrated a hypertensive response to exercise. There was no ST segment deviation noted during stress. No T wave inversion was noted during stress.   IMPRESSIONS Negative stress induced arrhythmias. Rare PACs and PVCs in recovery Stress ECG nondiagnostic due to failure to meet target heart rate.   RECOMMENDATIONS/CONCLUSIONS Stress ECG nondiagnostic due to failure to meet target heart rate.  Assessment and Plan:  1.  Paroxysmal atrial fibrillation/flutter with CHA2DS2-VASc score of 2.  He has had good rhythm control on  flecainide along with Toprol-XL.  Continue Eliquis for stroke prophylaxis, ECG reviewed reviewed with normal intervals.  Lab work from December 2022 also reviewed.  2.  Essential hypertension, also on lisinopril.  Medication Adjustments/Labs and Tests Ordered: Current medicines are reviewed at length with the patient today.  Concerns regarding medicines are outlined above.   Tests Ordered: Orders Placed This Encounter  Procedures   EKG 12-Lead    Medication Changes: No orders of the defined types were placed in this encounter.   Disposition:  Follow up  6 months.  Signed, Satira Sark, MD, [facility] 06/16/2021 1:10 PM    [facility] [place]. [address] [place], [place] [address] Phone: [phone]; Fax: [phone]

## 2021-06-16 ENCOUNTER — Other Ambulatory Visit: Payer: Self-pay

## 2021-06-16 ENCOUNTER — Encounter: Payer: Self-pay | Admitting: Cardiology

## 2021-06-16 ENCOUNTER — Ambulatory Visit: Payer: Medicare HMO | Admitting: Cardiology

## 2021-06-16 VITALS — BP 144/82 | HR 64 | Ht 72.0 in | Wt 172.3 lb

## 2021-06-16 DIAGNOSIS — I480 Paroxysmal atrial fibrillation: Secondary | ICD-10-CM | POA: Diagnosis not present

## 2021-06-16 DIAGNOSIS — I10 Essential (primary) hypertension: Secondary | ICD-10-CM

## 2021-06-16 NOTE — Patient Instructions (Addendum)

## 2021-08-02 ENCOUNTER — Other Ambulatory Visit (HOSPITAL_COMMUNITY): Payer: Self-pay | Admitting: Physician Assistant

## 2021-09-15 ENCOUNTER — Encounter (HOSPITAL_COMMUNITY): Payer: Self-pay | Admitting: Physician Assistant

## 2021-09-15 ENCOUNTER — Ambulatory Visit (HOSPITAL_COMMUNITY)
Admission: RE | Admit: 2021-09-15 | Discharge: 2021-09-15 | Disposition: A | Discharge status: Home / Self Care | Payer: Medicare HMO | Source: Ambulatory Visit | Attending: Physician Assistant | Admitting: Physician Assistant

## 2021-09-15 VITALS — BP 138/82 | HR 68 | Ht 72.0 in | Wt 177.8 lb

## 2021-09-15 DIAGNOSIS — Z79899 Other long term (current) drug therapy: Secondary | ICD-10-CM | POA: Diagnosis not present

## 2021-09-15 DIAGNOSIS — Z7901 Long term (current) use of anticoagulants: Secondary | ICD-10-CM | POA: Insufficient documentation

## 2021-09-15 DIAGNOSIS — I480 Paroxysmal atrial fibrillation: Secondary | ICD-10-CM | POA: Diagnosis present

## 2021-09-15 DIAGNOSIS — I10 Essential (primary) hypertension: Secondary | ICD-10-CM | POA: Diagnosis not present

## 2021-09-15 DIAGNOSIS — D6869 Other thrombophilia: Secondary | ICD-10-CM | POA: Insufficient documentation

## 2021-09-15 DIAGNOSIS — I4892 Unspecified atrial flutter: Secondary | ICD-10-CM | POA: Diagnosis not present

## 2021-09-15 DIAGNOSIS — E785 Hyperlipidemia, unspecified: Secondary | ICD-10-CM | POA: Diagnosis not present

## 2021-09-15 LAB — BASIC METABOLIC PANEL
Anion gap: 12 (ref 5–15)
BUN: 12 mg/dL (ref 8–23)
CO2: 19 mmol/L — ABNORMAL LOW (ref 22–32)
Calcium: 9.3 mg/dL (ref 8.9–10.3)
Chloride: 107 mmol/L (ref 98–111)
Creatinine, Ser: 1.07 mg/dL (ref 0.61–1.24)
GFR, Estimated: >60 mL/min (ref >60)
Glucose, Bld: 112 mg/dL — ABNORMAL HIGH (ref 70–99)
Potassium: 4.1 mmol/L (ref 3.5–5.1)
Sodium: 138 mmol/L (ref 135–145)

## 2021-09-15 LAB — CBC
HCT: 38.3 % — ABNORMAL LOW (ref 39.0–52.0)
Hemoglobin: 13.1 g/dL (ref 13.0–17.0)
MCH: 30.9 pg (ref 26.0–34.0)
MCHC: 34.2 g/dL (ref 30.0–36.0)
MCV: 90.3 fL (ref 80.0–100.0)
Platelets: 218 10*3/uL (ref 150–400)
RBC: 4.24 MIL/uL (ref 4.22–5.81)
RDW: 14.8 % (ref 11.5–15.5)
WBC: 4.5 10*3/uL (ref 4.0–10.5)
nRBC: 0 % (ref 0.0–0.2)

## 2021-09-15 NOTE — Progress Notes (Signed)
? ? ?Primary Care Physician: Wannetta Sender, FNP ?Primary Cardiologist: Dr Domenic Polite ?Primary Electrophysiologist: none ?Referring Physician: Dr Domenic Polite ? ? ?Pedro Gibbs is a 66 y.o. male with a history of HTN, HLD, atrial flutter, and atrial fibrillation who presents for follow up in the [facility]. The patient was initially diagnosed with atrial fibrillation 07/25/20 after presenting to [facility] ED with tachypalpitations and chest tightness. Patient is on Eliquis for a CHADS2VASC score of 2. He wore a cardiac monitor which showed atypical atrial flutter and atrial fibrillation with a 2% overall burden. He denies significant snoring and drinks 4-5 beers per week.  ? ?On follow up today, patient reports that he has done well since his last visit. He denies any interim episodes of afib. He is tolerating the medication without difficulty. He denies any bleeding issues on anticoagulation.  ? ?Today, he denies symptoms of palpitations, chest pain, shortness of breath, orthopnea, PND, lower extremity edema, dizziness, presyncope, syncope, snoring, daytime somnolence, bleeding, or neurologic sequela. The patient is tolerating medications without difficulties and is otherwise without complaint today.  ? ? ?Atrial Fibrillation Risk Factors: ? ?he does not have symptoms or diagnosis of sleep apnea. ?he does not have a history of rheumatic fever. ?he does have a history of alcohol use. ?The patient does have a history of early familial atrial fibrillation or other arrhythmias. Cousin/niece have afib. ? ?he has a BMI of Body mass index is 24.11 kg/m?Marland KitchenMarland Kitchen ?Filed Weights  ? 09/15/21 0927  ?Weight: 80.6 kg  ? ? ? ? ?Family History  ?Problem Relation Age of Onset  ? Stroke Father   ? Colon cancer Brother   ?     67s  ? Throat cancer Brother   ? Colon cancer Paternal Uncle   ?     61s  ? Throat cancer Paternal Uncle   ? ? ? ?Atrial Fibrillation Management history: ? ?Previous antiarrhythmic  drugs: flecainide  ?Previous cardioversions: none ?Previous ablations: none ?CHADS2VASC score: 2 ?Anticoagulation history: Eliquis ? ? ?Past Medical History:  ?Diagnosis Date  ? Anemia   ? Essential hypertension   ? Mixed hyperlipidemia   ? Paroxysmal atrial fibrillation (HCC)   ? ?Past Surgical History:  ?Procedure Laterality Date  ? BACK SURGERY    ? COLONOSCOPY  11/20/2009  ? Dr. Laural Golden; 2 small diverticula in sigmoid colon, one 91m hyperplastic rectal polyp ablated via cold biopsy, small external hemorrhoids. Recommended repeat exam in 5 years due to family history.  ? COLONOSCOPY  11/26/2014  ? Dr. BBritta Mccreedy mild diverticulosis in descending and sigmoid colon, small flat polyp in rectum s/p polypectomy.  No pathology received.  Recommended repeat in 5 years.  ? COLONOSCOPY WITH PROPOFOL N/A 05/11/2021  ? Procedure: COLONOSCOPY WITH PROPOFOL;  Surgeon: CEloise Harman DO;  Location[facility];  Service: Endoscopy;  Laterality: N/A;  9:30am  ? SHOULDER ARTHROSCOPY Right   ? ? ?Current Outpatient Medications  ?Medication Sig Dispense Refill  ? acetaminophen (TYLENOL) 500 MG tablet Take 1,000 mg by mouth every 8 (eight) hours as needed for moderate pain.    ? allopurinol (ZYLOPRIM) 300 MG tablet Take 300 mg by mouth daily as needed (gout).    ? diphenhydramine-acetaminophen (TYLENOL PM) 25-500 MG TABS tablet Take 2 tablets by mouth at bedtime as needed (sleep).    ? ELIQUIS 5 MG TABS tablet TAKE (1) TABLET TWICE DAILY. 60 tablet 6  ? flecainide (TAMBOCOR) 50 MG tablet TAKE (1) TABLET  TWICE DAILY. 60 tablet 1  ? lisinopril (ZESTRIL) 20 MG tablet Take 20 mg by mouth daily.    ? metoprolol succinate (TOPROL-XL) 50 MG 24 hr tablet TAKE 1 TABLET TWICE DAILY IMMEDIATELY FOLLOWING A MEAL. 60 tablet 6  ? Omega-3 1000 MG CAPS Take 1,000 mg by mouth daily.    ? rosuvastatin (CRESTOR) 10 MG tablet Take 10 mg by mouth daily.    ? sildenafil (REVATIO) 20 MG tablet Take 40 mg by mouth daily as needed (ED).    ?  tetrahydrozoline-zinc (VISINE-AC) 0.05-0.25 % ophthalmic solution Place 2 drops into both eyes 3 (three) times daily as needed.    ? ?No current facility-administered medications for this encounter.  ? ? ?Allergies  ?Allergen Reactions  ? Asa [Aspirin] Nausea Only  ? ? ?Social History  ? ?Socioeconomic History  ? Marital status: Widowed  ?  Spouse name: Not on file  ? Number of children: Not on file  ? Years of education: Not on file  ? Highest education level: Not on file  ?Occupational History  ? Not on file  ?Tobacco Use  ? Smoking status: Former  ?  Types: Cigarettes  ?  Quit date: 2005  ?  Years since quitting: 18.2  ? Smokeless tobacco: Never  ? Tobacco comments:  ?  Former smoker 03/16/2021  ?Vaping Use  ? Vaping Use: Never used  ?Substance and Sexual Activity  ? Alcohol use: Yes  ?  Alcohol/week: 3.0 standard drinks  ?  Types: 3 Cans of beer per week  ?  Comment: rare   ? Drug use: Never  ? Sexual activity: Not on file  ?Other Topics Concern  ? Not on file  ?Social History Narrative  ? Not on file  ? ?Social Determinants of Health  ? ?Financial Resource Strain: Not on file  ?Food Insecurity: Not on file  ?Transportation Needs: Not on file  ?Physical Activity: Not on file  ?Stress: Not on file  ?Social Connections: Not on file  ?Intimate Partner Violence: Not on file  ? ? ? ?ROS- All systems are reviewed and negative except as per the HPI above. ? ?Physical Exam: ?Vitals:  ? 09/15/21 0927  ?BP: 138/82  ?Pulse: 68  ?Weight: 80.6 kg  ?Height: 6' (1.829 m)  ? ? ? ?GEN- The patient is a well appearing male, alert and oriented x 3 today.   ?HEENT-head normocephalic, atraumatic, sclera clear, conjunctiva pink, hearing intact, trachea midline. ?Lungs- Clear to ausculation bilaterally, normal work of breathing ?Heart- Regular rate and rhythm, no murmurs, rubs or gallops  ?GI- soft, NT, ND, + BS ?Extremities- no clubbing, cyanosis, or edema ?MS- no significant deformity or atrophy ?Skin- no rash or lesion ?Psych-  euthymic mood, full affect ?Neuro- strength and sensation are intact ? ? ?Wt Readings from Last 3 Encounters:  ?09/15/21 80.6 kg  ?06/16/21 78.2 kg  ?05/11/21 78.9 kg  ? ? ?EKG today demonstrates  ?SR ?Vent. rate 68 BPM ?PR interval 168 ms ?QRS duration 88 ms ?QT/QTcB 382/406 ms ? ?Echo 08/28/20 demonstrated  ?1. Left ventricular ejection fraction, by estimation, is 60 to 65%. The  ?left ventricle has normal function. The left ventricle has no regional  ?wall motion abnormalities. Left ventricular diastolic parameters were  ?normal.  ? 2. Right ventricular systolic function is normal. The right ventricular  ?size is normal.  ? 3. The mitral valve is normal in structure. Mild mitral valve  ?regurgitation.  ? 4. The aortic valve is tricuspid. Aortic  valve regurgitation is not  ?visualized. Mild aortic valve sclerosis is present, with no evidence of  ?aortic valve stenosis.  ? 5. The inferior vena cava is normal in size with greater than 50%  ?respiratory variability, suggesting right atrial pressure of 3 mmHg.  ? ?Epic records are reviewed at length today ? ?CHA2DS2-VASc Score = 2  ?The patient's score is based upon: ?CHF History: 0 ?HTN History: 1 ?Diabetes History: 0 ?Stroke History: 0 ?Vascular Disease History: 0 ?Age Score: 1 ?Gender Score: 0 ?    ? ? ? ?ASSESSMENT AND PLAN: ?1. Paroxysmal Atrial Fibrillation/atrial flutter ?The patient's CHA2DS2-VASc score is 2, indicating a 2.2% annual risk of stroke.   ?Patient appears to be maintaining SR.  ?Continue flecainide 50 mg BID ?If he fails flecainide, he would be interested in ablation.  ?Continue Toprol 50 mg BID ?Continue Eliquis 5 mg BID ? ?2. Secondary Hypercoagulable State (ICD10:  D68.69) ?The patient is at significant risk for stroke/thromboembolism based upon his CHA2DS2-VASc Score of 2.  Continue Apixaban (Eliquis).  ? ?3. HTN ?Stable, no changes today. ? ? ?Follow up with Dr Domenic Polite per recall. AF clinic in one year.  ? ? ?Ricky Franciszek Platten PA-C ?Afib  Clinic ?[facility] ?[address] ?[place], [place] [address] ?[phone] ?09/15/2021 ?9:41 AM ? ?

## 2021-09-30 ENCOUNTER — Other Ambulatory Visit (HOSPITAL_COMMUNITY): Payer: Self-pay | Admitting: Physician Assistant

## 2021-11-28 ENCOUNTER — Other Ambulatory Visit (HOSPITAL_COMMUNITY): Payer: Self-pay | Admitting: Physician Assistant

## 2021-11-29 ENCOUNTER — Other Ambulatory Visit (HOSPITAL_COMMUNITY): Payer: Self-pay | Admitting: Physician Assistant

## 2021-12-14 ENCOUNTER — Other Ambulatory Visit (HOSPITAL_COMMUNITY): Payer: Self-pay | Admitting: Physician Assistant

## 2022-09-06 NOTE — Progress Notes (Unsigned)
    Cardiology Office Note  Date: 09/07/2022   ID: Trask, Wnek Nov 28, 1955, MRN [id number]  History of Present Illness: Pedro Gibbs is a 67 y.o. male last seen in January 2023 and then in the atrial fibrillation clinic in April 2023.  He is here for a routine visit.  Reports no significant palpitations on current medication, no chest pain or unusual shortness of breath.  I reviewed his current regimen, he reports no spontaneous bleeding problems on Eliquis and has done very well on flecainide along with Toprol-XL.  Still following with PCP.  States that he checks his blood pressure at home, generally systolics in the [id number] range.  I reviewed his lab work from December.  Today's ECG shows normal sinus rhythm.  Normal QTc and QRS.  Physical Exam: VS:  BP (!) 172/80   Pulse 64   Ht 6' (1.829 m)   Wt 176 lb (79.8 kg)   SpO2 98%   BMI 23.87 kg/m , BMI Body mass index is 23.87 kg/m.  Wt Readings from Last 3 Encounters:  09/07/22 176 lb (79.8 kg)  09/15/21 177 lb 12.8 oz (80.6 kg)  06/16/21 172 lb 4.8 oz (78.2 kg)    General: Patient appears comfortable at rest. HEENT: Conjunctiva and lids normal. Neck: Supple, no elevated JVP or carotid bruits. Lungs: Clear to auscultation, nonlabored breathing at rest. Cardiac: Regular rate and rhythm, no S3 or significant systolic murmur. Extremities: No pitting edema.  ECG:  An ECG dated 09/15/2021 was personally reviewed today and demonstrated:  Sinus rhythm.  Labwork: 09/15/2021: BUN 12; Creatinine, Ser 1.07; Hemoglobin 13.1; Platelets 218; Potassium 4.1; Sodium 138  December 2023: Hemoglobin 13.8, platelets 225, BUN 12, creatinine 0.85, potassium 4.1, AST 40, ALT 26, cholesterol 202, triglycerides 168, HDL 110, LDL 65  Other Studies Reviewed Today:  No interval cardiac testing for review today.  Assessment and Plan:  1.  Paroxysmal atrial fibrillation/flutter with CHA2DS2-VASc score of 2.  Doing very well in terms of symptom  control on flecainide along with Toprol-XL.  ECG reviewed.  Continue Eliquis for stroke prophylaxis.  2.  Essential hypertension.  Currently on Lotensin 20 mg daily.  Keep follow-up with PCP.  Could consider addition of chlorthalidone or Aldactone next if needed.  Disposition:  Follow up  6 months.  Signed, Satira Sark, M.D., F.A.[name]

## 2022-09-07 ENCOUNTER — Encounter: Payer: Self-pay | Admitting: Cardiology

## 2022-09-07 ENCOUNTER — Ambulatory Visit: Payer: Medicare HMO | Attending: Cardiology | Admitting: Cardiology

## 2022-09-07 VITALS — BP 138/82 | HR 64 | Ht 72.0 in | Wt 176.0 lb

## 2022-09-07 DIAGNOSIS — I480 Paroxysmal atrial fibrillation: Secondary | ICD-10-CM | POA: Diagnosis not present

## 2022-09-07 DIAGNOSIS — I10 Essential (primary) hypertension: Secondary | ICD-10-CM

## 2022-09-07 NOTE — Patient Instructions (Signed)
Medication Instructions:  Your physician recommends that you continue on your current medications as directed. Please refer to the Current Medication list given to you today.  *If you need a refill on your cardiac medications before your next appointment, please call your pharmacy*   Lab Work: None If you have labs (blood work) drawn today and your tests are completely normal, you will receive your results only by: [place] (if you have MyChart) OR A paper copy in the mail If you have any lab test that is abnormal or we need to change your treatment, we will call you to review the results.   Testing/Procedures: None   Follow-Up: At [facility], you and your health needs are our priority.  As part of our continuing mission to provide you with exceptional heart care, we have created designated Provider Care Teams.  These Care Teams include your primary Cardiologist (physician) and Advanced Practice Providers (APPs -  Physician Assistants and Nurse Practitioners) who all work together to provide you with the care you need, when you need it.  We recommend signing up for the patient portal called "MyChart".  Sign up information is provided on this After Visit Summary.  MyChart is used to connect with patients for Virtual Visits (Telemedicine).  Patients are able to view lab/test results, encounter notes, upcoming appointments, etc.  Non-urgent messages can be sent to your provider as well.   To learn more about what you can do with MyChart, go to [email or web address].    Your next appointment:   6 month(s)  Provider:   Rozann Lesches, MD    Other Instructions

## 2022-09-19 ENCOUNTER — Encounter (HOSPITAL_COMMUNITY): Payer: Self-pay | Admitting: Physician Assistant

## 2022-09-19 ENCOUNTER — Ambulatory Visit (HOSPITAL_COMMUNITY)
Admission: RE | Admit: 2022-09-19 | Discharge: 2022-09-19 | Disposition: A | Discharge status: Home / Self Care | Payer: Medicare HMO | Source: Ambulatory Visit | Attending: Physician Assistant | Admitting: Physician Assistant

## 2022-09-19 VITALS — BP 138/88 | HR 69 | Ht 72.0 in | Wt 174.8 lb

## 2022-09-19 DIAGNOSIS — Z79899 Other long term (current) drug therapy: Secondary | ICD-10-CM | POA: Diagnosis not present

## 2022-09-19 DIAGNOSIS — D6869 Other thrombophilia: Secondary | ICD-10-CM | POA: Diagnosis not present

## 2022-09-19 DIAGNOSIS — I10 Essential (primary) hypertension: Secondary | ICD-10-CM | POA: Diagnosis not present

## 2022-09-19 DIAGNOSIS — Z7901 Long term (current) use of anticoagulants: Secondary | ICD-10-CM | POA: Insufficient documentation

## 2022-09-19 DIAGNOSIS — Z5181 Encounter for therapeutic drug level monitoring: Secondary | ICD-10-CM | POA: Diagnosis not present

## 2022-09-19 DIAGNOSIS — I480 Paroxysmal atrial fibrillation: Secondary | ICD-10-CM

## 2022-09-19 DIAGNOSIS — I4892 Unspecified atrial flutter: Secondary | ICD-10-CM | POA: Insufficient documentation

## 2022-09-19 LAB — BASIC METABOLIC PANEL
Anion gap: 11 (ref 5–15)
BUN: 11 mg/dL (ref 8–23)
CO2: 21 mmol/L — ABNORMAL LOW (ref 22–32)
Calcium: 9.4 mg/dL (ref 8.9–10.3)
Chloride: 104 mmol/L (ref 98–111)
Creatinine, Ser: 0.91 mg/dL (ref 0.61–1.24)
GFR, Estimated: >60 mL/min (ref >60)
Glucose, Bld: 118 mg/dL — ABNORMAL HIGH (ref 70–99)
Potassium: 4 mmol/L (ref 3.5–5.1)
Sodium: 136 mmol/L (ref 135–145)

## 2022-09-19 LAB — CBC
HCT: 40.4 % (ref 39.0–52.0)
Hemoglobin: 13.7 g/dL (ref 13.0–17.0)
MCH: 30.9 pg (ref 26.0–34.0)
MCHC: 33.9 g/dL (ref 30.0–36.0)
MCV: 91.2 fL (ref 80.0–100.0)
Platelets: 214 10*3/uL (ref 150–400)
RBC: 4.43 MIL/uL (ref 4.22–5.81)
RDW: 14.6 % (ref 11.5–15.5)
WBC: 4.4 10*3/uL (ref 4.0–10.5)
nRBC: 0 % (ref 0.0–0.2)

## 2022-09-19 MED ORDER — METOPROLOL SUCCINATE ER 50 MG PO TB24
ORAL_TABLET | ORAL | 3 refills | Status: DC
Start: 2022-09-19 — End: 2023-09-15

## 2022-09-19 MED ORDER — FLECAINIDE ACETATE 50 MG PO TABS
ORAL_TABLET | ORAL | 3 refills | Status: DC
Start: 2022-09-19 — End: 2023-09-20

## 2022-09-19 MED ORDER — APIXABAN 5 MG PO TABS: 5.0000 mg | ORAL_TABLET | Freq: Two times a day (BID) | ORAL | 3 refills | Status: DC

## 2022-09-19 NOTE — Progress Notes (Signed)
Primary Care Physician: Jason Coop, FNP Primary Cardiologist: Dr Diona Browner Primary Electrophysiologist: none Referring Physician: Dr Claude Manges is a 67 y.o. male with a history of HTN, HLD, atrial flutter, and atrial fibrillation who presents for follow up in the [facility]. The patient was initially diagnosed with atrial fibrillation 07/25/20 after presenting to [facility] ED with tachypalpitations and chest tightness. Patient is on Eliquis for a CHADS2VASC score of 2. He wore a cardiac monitor which showed atypical atrial flutter and atrial fibrillation with a 2% overall burden. He denies significant snoring and drinks 4-5 beers per week.   On follow up today, patient reports that he has done very well since his last visit. He has not had any tachypalpitations. No bleeding issues on anticoagulation.   Today, he denies symptoms of palpitations, chest pain, shortness of breath, orthopnea, PND, lower extremity edema, dizziness, presyncope, syncope, snoring, daytime somnolence, bleeding, or neurologic sequela. The patient is tolerating medications without difficulties and is otherwise without complaint today.    Atrial Fibrillation Risk Factors:  he does not have symptoms or diagnosis of sleep apnea. he does not have a history of rheumatic fever. he does have a history of alcohol use. The patient does have a history of early familial atrial fibrillation or other arrhythmias. Cousin/niece have afib.  he has a BMI of Body mass index is 23.71 kg/m.Marland Kitchen Filed Weights   09/19/22 0920  Weight: 79.3 kg    Family History  Problem Relation Age of Onset   Stroke Father    Colon cancer Brother        71s   Throat cancer Brother    Colon cancer Paternal Uncle        49s   Throat cancer Paternal Uncle      Atrial Fibrillation Management history:  Previous antiarrhythmic drugs: flecainide  Previous cardioversions: none Previous  ablations: none CHADS2VASC score: 2 Anticoagulation history: Eliquis   Past Medical History:  Diagnosis Date   Anemia    Essential hypertension    Mixed hyperlipidemia    Paroxysmal atrial fibrillation    Past Surgical History:  Procedure Laterality Date   BACK SURGERY     COLONOSCOPY  11/20/2009   Dr. Karilyn Cota; 2 small diverticula in sigmoid colon, one 4mm hyperplastic rectal polyp ablated via cold biopsy, small external hemorrhoids. Recommended repeat exam in 5 years due to family history.   COLONOSCOPY  11/26/2014   Dr. Teena Dunk; mild diverticulosis in descending and sigmoid colon, small flat polyp in rectum s/p polypectomy.  No pathology received.  Recommended repeat in 5 years.   COLONOSCOPY WITH PROPOFOL N/A 05/11/2021   Procedure: COLONOSCOPY WITH PROPOFOL;  Surgeon: Lanelle Bal, DO;  Location: [facility];  Service: Endoscopy;  Laterality: N/A;  9:30am   SHOULDER ARTHROSCOPY Right     Current Outpatient Medications  Medication Sig Dispense Refill   acetaminophen (TYLENOL) 500 MG tablet Take 1,000 mg by mouth every 8 (eight) hours as needed for moderate pain.     allopurinol (ZYLOPRIM) 300 MG tablet Take 300 mg by mouth daily as needed (gout).     diphenhydramine-acetaminophen (TYLENOL PM) 25-500 MG TABS tablet Take 2 tablets by mouth at bedtime as needed (sleep).     lisinopril (ZESTRIL) 20 MG tablet Take 20 mg by mouth daily.     Omega-3 1000 MG CAPS Take 1,000 mg by mouth daily.     rosuvastatin (CRESTOR) 10 MG tablet Take  10 mg by mouth daily.     sildenafil (REVATIO) 20 MG tablet Take 40 mg by mouth daily as needed (ED).     tetrahydrozoline-zinc (VISINE-AC) 0.05-0.25 % ophthalmic solution Place 2 drops into both eyes 3 (three) times daily as needed.     apixaban (ELIQUIS) 5 MG TABS tablet Take 1 tablet (5 mg total) by mouth 2 (two) times daily. 180 tablet 3   flecainide (TAMBOCOR) 50 MG tablet TAKE (1) TABLET TWICE DAILY. 180 tablet 3   metoprolol succinate  (TOPROL-XL) 50 MG 24 hr tablet TAKE 1 TABLET TWICE DAILY IMMEDIATELY FOLLOWING A MEAL. 180 tablet 3   No current facility-administered medications for this encounter.    Allergies  Allergen Reactions   Asa [Aspirin] Nausea Only    Social History   Socioeconomic History   Marital status: Widowed    Spouse name: Not on file   Number of children: Not on file   Years of education: Not on file   Highest education level: Not on file  Occupational History   Not on file  Tobacco Use   Smoking status: Former    Types: Cigarettes    Quit date: 2005    Years since quitting: 19.2   Smokeless tobacco: Never   Tobacco comments:    Former smoker 03/16/2021  Vaping Use   Vaping Use: Never used  Substance and Sexual Activity   Alcohol use: Yes    Alcohol/week: 3.0 standard drinks of alcohol    Types: 3 Cans of beer per week    Comment: rare    Drug use: Never   Sexual activity: Not on file  Other Topics Concern   Not on file  Social History Narrative   Not on file   Social Determinants of Health   Financial Resource Strain: Not on file  Food Insecurity: Not on file  Transportation Needs: Not on file  Physical Activity: Not on file  Stress: Not on file  Social Connections: Not on file  Intimate Partner Violence: Not on file     ROS- All systems are reviewed and negative except as per the HPI above.  Physical Exam: Vitals:   09/19/22 0920  BP: 138/88  Pulse: 69  Weight: 79.3 kg  Height: 6' (1.829 m)    GEN- The patient is a well appearing male, alert and oriented x 3 today.   HEENT-head normocephalic, atraumatic, sclera clear, conjunctiva pink, hearing intact, trachea midline. Lungs- Clear to ausculation bilaterally, normal work of breathing Heart- Regular rate and rhythm, no murmurs, rubs or gallops  GI- soft, NT, ND, + BS Extremities- no clubbing, cyanosis, or edema MS- no significant deformity or atrophy Skin- no rash or lesion Psych- euthymic mood, full  affect Neuro- strength and sensation are intact   Wt Readings from Last 3 Encounters:  09/19/22 79.3 kg  09/07/22 79.8 kg  09/15/21 80.6 kg    EKG today demonstrates  SR Vent. rate 69 BPM PR interval 154 ms QRS duration 92 ms QT/QTcB 386/413 ms  Echo 08/28/20 demonstrated  1. Left ventricular ejection fraction, by estimation, is 60 to 65%. The  left ventricle has normal function. The left ventricle has no regional  wall motion abnormalities. Left ventricular diastolic parameters were  normal.   2. Right ventricular systolic function is normal. The right ventricular  size is normal.   3. The mitral valve is normal in structure. Mild mitral valve  regurgitation.   4. The aortic valve is tricuspid. Aortic valve regurgitation is  not  visualized. Mild aortic valve sclerosis is present, with no evidence of  aortic valve stenosis.   5. The inferior vena cava is normal in size with greater than 50%  respiratory variability, suggesting right atrial pressure of 3 mmHg.   Epic records are reviewed at length today  CHA2DS2-VASc Score = 2  The patient's score is based upon: CHF History: 0 HTN History: 1 Diabetes History: 0 Stroke History: 0 Vascular Disease History: 0 Age Score: 1 Gender Score: 0        ASSESSMENT AND PLAN: 1. Paroxysmal Atrial Fibrillation/atrial flutter The patient's CHA2DS2-VASc score is 2, indicating a 2.2% annual risk of stroke.   Patient appears to be maintaining SR.  Continue flecainide 50 mg BID Continue Toprol 50 mg BID Continue Eliquis 5 mg BID  2. Secondary Hypercoagulable State (ICD10:  D68.69) The patient is at significant risk for stroke/thromboembolism based upon his CHA2DS2-VASc Score of 2.  Continue Apixaban (Eliquis).   3. HTN Stable, no changes today.   Follow up with Dr Diona Browner per recall. AF clinic in one year.    Jorja Loa PA-C [facility] [address] [place], [place]  [address] [phone] 09/19/2022 12:02 PM

## 2023-06-19 ENCOUNTER — Emergency Department (HOSPITAL_COMMUNITY)
Admission: EM | Admit: 2023-06-19 | Discharge: 2023-06-19 | Disposition: A | Discharge status: Home / Self Care | Payer: PPO | Attending: Emergency Medicine | Admitting: Emergency Medicine

## 2023-06-19 ENCOUNTER — Encounter (HOSPITAL_COMMUNITY): Payer: Self-pay

## 2023-06-19 ENCOUNTER — Other Ambulatory Visit: Payer: Self-pay

## 2023-06-19 ENCOUNTER — Emergency Department (HOSPITAL_COMMUNITY): Payer: PPO

## 2023-06-19 DIAGNOSIS — Z7901 Long term (current) use of anticoagulants: Secondary | ICD-10-CM | POA: Insufficient documentation

## 2023-06-19 DIAGNOSIS — Z79899 Other long term (current) drug therapy: Secondary | ICD-10-CM | POA: Insufficient documentation

## 2023-06-19 DIAGNOSIS — I10 Essential (primary) hypertension: Secondary | ICD-10-CM | POA: Insufficient documentation

## 2023-06-19 DIAGNOSIS — L03213 Periorbital cellulitis: Secondary | ICD-10-CM | POA: Diagnosis not present

## 2023-06-19 DIAGNOSIS — H1031 Unspecified acute conjunctivitis, right eye: Secondary | ICD-10-CM | POA: Diagnosis not present

## 2023-06-19 DIAGNOSIS — H5789 Other specified disorders of eye and adnexa: Secondary | ICD-10-CM | POA: Diagnosis present

## 2023-06-19 LAB — CBC WITH DIFFERENTIAL/PLATELET
Abs Immature Granulocytes: 0.01 10*3/uL (ref 0.00–0.07)
Basophils Absolute: 0 10*3/uL (ref 0.0–0.1)
Basophils Relative: 0 %
Eosinophils Absolute: 0.1 10*3/uL (ref 0.0–0.5)
Eosinophils Relative: 2 %
HCT: 41 % (ref 39.0–52.0)
Hemoglobin: 14.1 g/dL (ref 13.0–17.0)
Immature Granulocytes: 0 %
Lymphocytes Relative: 26 %
Lymphs Abs: 1.2 10*3/uL (ref 0.7–4.0)
MCH: 31.7 pg (ref 26.0–34.0)
MCHC: 34.4 g/dL (ref 30.0–36.0)
MCV: 92.1 fL (ref 80.0–100.0)
Monocytes Absolute: 0.5 10*3/uL (ref 0.1–1.0)
Monocytes Relative: 10 %
Neutro Abs: 2.8 10*3/uL (ref 1.7–7.7)
Neutrophils Relative %: 62 %
Platelets: 211 10*3/uL (ref 150–400)
RBC: 4.45 MIL/uL (ref 4.22–5.81)
RDW: 13.5 % (ref 11.5–15.5)
WBC: 4.6 10*3/uL (ref 4.0–10.5)
nRBC: 0 % (ref 0.0–0.2)

## 2023-06-19 LAB — COMPREHENSIVE METABOLIC PANEL
ALT: 31 U/L (ref 0–44)
AST: 38 U/L (ref 15–41)
Albumin: 3.8 g/dL (ref 3.5–5.0)
Alkaline Phosphatase: 41 U/L (ref 38–126)
Anion gap: 8 (ref 5–15)
BUN: 13 mg/dL (ref 8–23)
CO2: 22 mmol/L (ref 22–32)
Calcium: 9.6 mg/dL (ref 8.9–10.3)
Chloride: 106 mmol/L (ref 98–111)
Creatinine, Ser: 0.73 mg/dL (ref 0.61–1.24)
GFR, Estimated: >60 mL/min (ref >60)
Glucose, Bld: 101 mg/dL — ABNORMAL HIGH (ref 70–99)
Potassium: 4 mmol/L (ref 3.5–5.1)
Sodium: 136 mmol/L (ref 135–145)
Total Bilirubin: 0.7 mg/dL (ref 0.0–1.2)
Total Protein: 7.5 g/dL (ref 6.5–8.1)

## 2023-06-19 MED ORDER — TETRACAINE HCL 0.5 % OP SOLN
1.0000 [drp] | Freq: Once | OPHTHALMIC | Status: AC
  Administered 2023-06-19: 1 [drp] via OPHTHALMIC
  Filled 2023-06-19: qty 4

## 2023-06-19 MED ORDER — IOHEXOL 300 MG/ML  SOLN
75.0000 mL | Freq: Once | INTRAMUSCULAR | Status: AC | PRN
  Administered 2023-06-19: 75 mL via INTRAVENOUS

## 2023-06-19 MED ORDER — FLUORESCEIN SODIUM 1 MG OP STRP
1.0000 | ORAL_STRIP | Freq: Once | OPHTHALMIC | Status: AC
Start: 2023-06-19 — End: 2023-06-19
  Administered 2023-06-19: 1 via OPHTHALMIC
  Filled 2023-06-19: qty 1

## 2023-06-19 MED ORDER — METOPROLOL TARTRATE 25 MG PO TABS
25.0000 mg | ORAL_TABLET | Freq: Once | ORAL | Status: AC
  Administered 2023-06-19: 25 mg via ORAL
  Filled 2023-06-19: qty 1

## 2023-06-19 MED ORDER — AMOXICILLIN-POT CLAVULANATE 875-125 MG PO TABS
1.0000 | ORAL_TABLET | Freq: Once | ORAL | Status: AC
  Administered 2023-06-19: 1 via ORAL
  Filled 2023-06-19: qty 1

## 2023-06-19 MED ORDER — ERYTHROMYCIN 5 MG/GM OP OINT: TOPICAL_OINTMENT | OPHTHALMIC | 0 refills | Status: AC

## 2023-06-19 MED ORDER — AMOXICILLIN-POT CLAVULANATE 875-125 MG PO TABS: 1.0000 | ORAL_TABLET | Freq: Two times a day (BID) | ORAL | 0 refills | Status: DC

## 2023-06-19 MED ORDER — ERYTHROMYCIN 5 MG/GM OP OINT
TOPICAL_OINTMENT | Freq: Once | OPHTHALMIC | Status: AC
  Filled 2023-06-19: qty 3.5

## 2023-06-19 MED ORDER — LISINOPRIL 10 MG PO TABS
20.0000 mg | ORAL_TABLET | Freq: Once | ORAL | Status: AC
Start: 2023-06-19 — End: 2023-06-19
  Administered 2023-06-19: 20 mg via ORAL
  Filled 2023-06-19: qty 2

## 2023-06-19 NOTE — ED Provider Notes (Signed)
 [place] EMERGENCY DEPARTMENT AT [facility] Provider Note   CSN: [id number] Arrival date & time: 06/19/23  1054     History  Chief Complaint  Patient presents with   Eye Drainage    Pedro Gibbs is a 68 y.o. male.  HPI Patient presents for right eye discharge.  Medical history includes atrial fibrillation, HTN, HLD, anemia.  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  He feels like he scratched his right eye with an eyelash 4 days ago.  He has not had any significant eye pain.  He has noticed some redness and drainage.  This morning, drainage had crusted his eyes shut.  He has developed some redness and swelling around his right eye as well.  He has no pain with extraocular movements.  He states that his vision seems fine out of his right eye.  He went to urgent care prior to arrival and they told him they cannot provide antibiotics and for him to come to the ED.    Home Medications Prior to Admission medications   Medication Sig Start Date End Date Taking? Authorizing Provider  amoxicillin -clavulanate (AUGMENTIN ) 875-125 MG tablet Take 1 tablet by mouth every 12 (twelve) hours. 06/19/23  Yes Melvenia Motto, MD  erythromycin  ophthalmic ointment Place a 1/2 inch ribbon of ointment into the lower eyelid. 06/19/23  Yes Melvenia Motto, MD  acetaminophen (TYLENOL) 500 MG tablet Take 1,000 mg by mouth every 8 (eight) hours as needed for moderate pain.    [provider]  allopurinol (ZYLOPRIM) 300 MG tablet Take 300 mg by mouth daily as needed (gout). 02/07/19   [provider]  apixaban  (ELIQUIS ) 5 MG TABS tablet Take 1 tablet (5 mg total) by mouth 2 (two) times daily. 09/19/22   Fenton, Clint R, PA  diphenhydramine-acetaminophen (TYLENOL PM) 25-500 MG TABS tablet Take 2 tablets by mouth at bedtime as needed (sleep).    [provider]  flecainide  (TAMBOCOR ) 50 MG tablet TAKE (1) TABLET TWICE DAILY. 09/19/22   Fenton, Clint R, PA  lisinopril  (ZESTRIL ) 20 MG  tablet Take 20 mg by mouth daily. 04/12/19   [provider]  metoprolol  succinate (TOPROL -XL) 50 MG 24 hr tablet TAKE 1 TABLET TWICE DAILY IMMEDIATELY FOLLOWING A MEAL. 09/19/22   Fenton, Clint R, PA  Omega-3 1000 MG CAPS Take 1,000 mg by mouth daily.    [provider]  rosuvastatin (CRESTOR) 10 MG tablet Take 10 mg by mouth daily.    [provider]  sildenafil (REVATIO) 20 MG tablet Take 40 mg by mouth daily as needed (ED). 06/04/20   [provider]  tetrahydrozoline-zinc (VISINE-AC) 0.05-0.25 % ophthalmic solution Place 2 drops into both eyes 3 (three) times daily as needed.    [provider]      Allergies    Asa [aspirin]    Review of Systems   Review of Systems  HENT:  Positive for facial swelling.   Eyes:  Positive for discharge and redness.  All other systems reviewed and are negative.   Physical Exam Updated Vital Signs BP (!) 201/104 (BP Location: Right Arm)   Pulse 69   Temp 98.9 F (37.2 C) (Oral)   Resp 16   Ht 6' (1.829 m)   Wt 79 kg   SpO2 97%   BMI 23.62 kg/m  Physical Exam Vitals and nursing note reviewed.  Constitutional:      General: He is not in acute distress.    Appearance: Normal  appearance. He is well-developed. He is not ill-appearing, toxic-appearing or diaphoretic.  HENT:     Head: Normocephalic and atraumatic.     Right Ear: External ear normal.     Left Ear: External ear normal.     Nose: Nose normal.     Mouth/Throat:     Mouth: Mucous membranes are moist.  Eyes:     Comments: Left eye is normal in appearance.  He is chronically blind in his left eye.  Right eye shows conjunctival injection.  Pupil is normal in appearance and reactive.  There is no ciliary flush.  No foreign bodies appreciated.  IOP is 20.  No corneal fluorescein  uptake appreciated under Woods lamp.  Cardiovascular:     Rate and Rhythm: Normal rate and regular rhythm.  Pulmonary:     Effort: Pulmonary effort is normal. No  respiratory distress.  Abdominal:     General: There is no distension.     Palpations: Abdomen is soft.  Musculoskeletal:        General: No swelling. Normal range of motion.     Cervical back: Normal range of motion and neck supple.  Skin:    General: Skin is warm and dry.     Coloration: Skin is not jaundiced or pale.  Neurological:     General: No focal deficit present.     Mental Status: He is alert and oriented to person, place, and time.  Psychiatric:        Mood and Affect: Mood normal.        Behavior: Behavior normal.     ED Results / Procedures / Treatments   Labs (all labs ordered are listed, but only abnormal results are displayed) Labs Reviewed  COMPREHENSIVE METABOLIC PANEL - Abnormal; Notable for the following components:      Result Value   Glucose, Bld 101 (*)    All other components within normal limits  CBC WITH DIFFERENTIAL/PLATELET    EKG None  Radiology No results found.  Procedures Procedures    Medications Ordered in ED Medications  tetracaine  (PONTOCAINE) 0.5 % ophthalmic solution 1 drop (1 drop Right Eye Given 06/19/23 1245)  fluorescein  ophthalmic strip 1 strip (1 strip Right Eye Given 06/19/23 1245)  lisinopril  (ZESTRIL ) tablet 20 mg (20 mg Oral Given 06/19/23 1458)  metoprolol  tartrate (LOPRESSOR ) tablet 25 mg (25 mg Oral Given 06/19/23 1457)  erythromycin  ophthalmic ointment ( Right Eye Given 06/19/23 1458)  amoxicillin -clavulanate (AUGMENTIN ) 875-125 MG per tablet 1 tablet (1 tablet Oral Given 06/19/23 1457)  iohexol  (OMNIPAQUE ) 300 MG/ML solution 75 mL (75 mLs Intravenous Contrast Given 06/19/23 1532)    ED Course/ Medical Decision Making/ A&P                                 Medical Decision Making Amount and/or Complexity of Data Reviewed Labs: ordered. Radiology: ordered.  Risk Prescription drug management.   This patient presents to the ED for concern of right eye redness and drainage, this involves an extensive number of  treatment options, and is a complaint that carries with it a high risk of complications and morbidity.  The differential diagnosis includes conjunctivitis, corneal abrasion, corneal ulcer, glaucoma, periorbital cellulitis, orbital cellulitis, uveitis, foreign body   Co morbidities that complicate the patient evaluation  atrial fibrillation, HTN, HLD, anemia   Additional history obtained:  Additional history obtained from N/A External records from outside source obtained and reviewed including  EMR   Lab Tests:  I Ordered, and personally interpreted labs.  The pertinent results include: Normal kidney function, normal electrolytes, normal hemoglobin, no leukocytosis   Imaging Studies ordered:  I ordered imaging studies including CT orbits I independently visualized and interpreted imaging which showed pending at time of signout I agree with the radiologist interpretation  Problem List / ED Course / Critical interventions / Medication management  Patient presents for right eye redness and drainage for the past 3 to 4 days.  On arrival in the ED, vital signs are notable for elevated blood pressure.  On exam, patient is overall well-appearing.  He has diffuse conjunctival injection in his right eye with some swelling and erythema periorbital region.  Left eye is normal in appearance.  He states that he is chronically blind in his left eye.  He denies any vision changes to his right eye.  He denies any pain with EOM.  On Woods lamp exam with fluorescein  stain, do not appreciate any fluorescein  uptake.  IOP in right eye is 20.  No foreign bodies are appreciated.  I suspect conjunctivitis.  Patient was given erythromycin  ointment.  Periorbital skin findings consistent with periorbital cellulitis.  Patient was started on Augmentin .  Will obtain CT scan to rule out any further pathology.  If negative, patient is stable for discharge.  He does have an ophthalmologist that he can follow-up with.  He  was advised to do so.  Care of patient was signed out to oncoming ED provider. I ordered medication including lisinopril  and metoprolol  for hypertension; erythromycin  ointment for conjunctivitis; Augmentin  for periorbital cellulitis Reevaluation of the patient after these medicines showed that the patient improved I have reviewed the patients home medicines and have made adjustments as needed   Social Determinants of Health:  Has access to outpatient care        Final Clinical Impression(s) / ED Diagnoses Final diagnoses:  Periorbital cellulitis of right eye  Acute conjunctivitis of right eye, unspecified acute conjunctivitis type    Rx / DC Orders ED Discharge Orders          Ordered    erythromycin  ophthalmic ointment        06/19/23 1548    amoxicillin -clavulanate (AUGMENTIN ) 875-125 MG tablet  Every 12 hours        06/19/23 1548              Melvenia Motto, MD 06/19/23 1549

## 2023-06-19 NOTE — ED Notes (Signed)
 Patient says his vision was clear for a while shortly after getting the eye medication, but it is now blurry and seems to be gradually getting worse.

## 2023-06-19 NOTE — ED Provider Notes (Signed)
  Physical Exam  BP (!) 164/88 (BP Location: Left Arm)   Pulse 66   Temp 98.9 F (37.2 C) (Oral)   Resp 18   Ht 6' (1.829 m)   Wt 79 kg   SpO2 99%   BMI 23.62 kg/m   Physical Exam  Procedures  Procedures  ED Course / MDM   Clinical Course as of 06/20/23 2332  Mon Jun 19, 2023  1631 Assumed care from Dr. Melvenia.  68 year old male who presents to the emergency department with what appears to be right eye periorbital cellulitis.  Is awaiting a CT read to ensure that there is no orbital cellulitis.  Has already been given antibiotics here in the emergency department.  [RP]  1806 Discussed with radiology. Appears to not have any orbital cellulitis and appears to be all preseptal.  Patient has already received Augmentin .  Was prescribed this to take at home and instructed to follow-up as an outpatient for recheck of his cellulitis.  Discussed symptoms of orbital cellulitis that should prompt him to return to the emergency department. [RP]    Clinical Course User Index [RP] Yolande Lamar BROCKS, MD   Medical Decision Making Amount and/or Complexity of Data Reviewed Labs: ordered. Radiology: ordered.  Risk Prescription drug management.      Yolande Lamar BROCKS, MD 06/20/23 [phone]

## 2023-06-19 NOTE — ED Triage Notes (Signed)
 Pt arrived from home c/o right eye infection. Pt reports he may have scratched his eye. Pt seen at UC recently but was not prescribed an antibiotic.

## 2023-06-19 NOTE — Discharge Instructions (Addendum)
 Use antibiotic ointment on your right eye 4 times per day.  Take antibiotic tablet twice a day as prescribed for the next 7 days.  Prescriptions for these were sent to your pharmacy.  Follow-up with your eye doctor soon as possible.  Return to the emergency department for any new or worsening symptoms of concern.

## 2023-08-01 NOTE — Progress Notes (Signed)
 Cardiology Office Note:  .   Date:  08/14/2023  ID:  Pedro Gibbs, DOB [birth year], MRN [id number] PCP: Jason Coop, FNP  [facility] Providers Cardiologist:  Nona Dell, MD    History of Present Illness: .   Pedro Gibbs is a 68 y.o. male with history of HTN, PAF on flecainide and toprol, eliquis.  Patient comes in for regular f/u. Overall doing well. No chest pain, palpitations, dyspnea, dizziness or presyncope. Usually walks 1 mile 3 days a week if weather is nice. He has had a GI virus the past 2 weeks and diarrhea this am. BP up today he thinks it's from indigestion. BP 140-152/80's this weekend but usually 130's/70-80's. Careful about the salt in his diet.  ROS:    Studies Reviewed: Pedro Gibbs Kitchen    EKG Interpretation Date/Time:  Monday August 14 2023 12:22:44 EDT Ventricular Rate:  62 PR Interval:  172 QRS Duration:  86 QT Interval:  388 QTC Calculation: 393 R Axis:   45  Text Interpretation: Normal sinus rhythm Normal ECG When compared with ECG of 19-Sep-2022 09:22, No significant change was found Confirmed by Jacolyn Reedy [phone]) on 08/14/2023 12:36:51 PM    Prior CV Studies:    Echo 08/28/20 demonstrated  1. Left ventricular ejection fraction, by estimation, is 60 to 65%. The  left ventricle has normal function. The left ventricle has no regional  wall motion abnormalities. Left ventricular diastolic parameters were  normal.   2. Right ventricular systolic function is normal. The right ventricular  size is normal.   3. The mitral valve is normal in structure. Mild mitral valve  regurgitation.   4. The aortic valve is tricuspid. Aortic valve regurgitation is not  visualized. Mild aortic valve sclerosis is present, with no evidence of  aortic valve stenosis.   5. The inferior vena cava is normal in size with greater than 50%  respiratory variability, suggesting right atrial pressure of 3 mmHg.    Epic records are reviewed at length today  Risk  Assessment/Calculations:    CHA2DS2-VASc Score = 2   This indicates a 2.2% annual risk of stroke. The patient's score is based upon: CHF History: 0 HTN History: 1 Diabetes History: 0 Stroke History: 0 Vascular Disease History: 0 Age Score: 1 Gender Score: 0    HYPERTENSION CONTROL Vitals:   08/14/23 1212 08/14/23 1228 08/14/23 1253  BP: (!) 230/100 (!) 230/100 (!) 190/90    The patient's blood pressure is elevated above target today.  In order to address the patient's elevated BP: Blood pressure will be monitored at home to determine if medication changes need to be made.; A current anti-hypertensive medication was adjusted today.; Follow up with general cardiology has been recommended.          Physical Exam:   VS:  BP (!) 190/90   Pulse 64   Ht 6' (1.829 m)   Wt 178 lb 6.4 oz (80.9 kg)   SpO2 98%   BMI 24.20 kg/m    Wt Readings from Last 3 Encounters:  08/14/23 178 lb 6.4 oz (80.9 kg)  06/19/23 174 lb 2.6 oz (79 kg)  09/19/22 174 lb 12.8 oz (79.3 kg)    GEN: Well nourished, well developed in no acute distress NECK: No JVD; No carotid bruits CARDIAC:  RRR, no murmurs, rubs, gallops RESPIRATORY:  Clear to auscultation without rales, wheezing or rhonchi  ABDOMEN: Soft, non-tender, non-distended EXTREMITIES:  No edema; No deformity   ASSESSMENT AND PLAN: .  Paroxysmal Atrial Fibrillation/atrial flutter The patient's CHA2DS2-VASc score is 2, indicating a 2.2% annual risk of stroke.   Patient is maintaining SR.  Continue flecainide 50 mg BID Continue Toprol 50 mg BID Continue Eliquis 5 mg BID    Secondary Hypercoagulable State (ICD10:  D68.69) The patient is at significant risk for stroke/thromboembolism based upon his CHA2DS2-VASc Score of 2.  Continue Apixaban (Eliquis).    HTN BP very high today, has had GI upset. Running on the higher side at home as well. Labs done in Jan reviewed and stable. Will increase lisinopril 40 mg daily, bmet and FLP next week.  Bring BP cuff to office for nurse visit to recheck next week.  HLD on crestor-no recent lipids in system-will check.        Dispo: f/u next week and Dr. Diona Browner 6 months.  Signed, Jacolyn Reedy, PA-C

## 2023-08-14 ENCOUNTER — Encounter: Payer: Self-pay | Admitting: Physician Assistant

## 2023-08-14 ENCOUNTER — Ambulatory Visit: Payer: PPO | Attending: Physician Assistant | Admitting: Physician Assistant

## 2023-08-14 VITALS — BP 190/90 | HR 64 | Ht 72.0 in | Wt 178.4 lb

## 2023-08-14 DIAGNOSIS — Z79899 Other long term (current) drug therapy: Secondary | ICD-10-CM | POA: Diagnosis not present

## 2023-08-14 DIAGNOSIS — I480 Paroxysmal atrial fibrillation: Secondary | ICD-10-CM

## 2023-08-14 DIAGNOSIS — E782 Mixed hyperlipidemia: Secondary | ICD-10-CM | POA: Diagnosis not present

## 2023-08-14 DIAGNOSIS — I10 Essential (primary) hypertension: Secondary | ICD-10-CM

## 2023-08-14 MED ORDER — LISINOPRIL 40 MG PO TABS: 40.0000 mg | ORAL_TABLET | Freq: Every day | ORAL | 1 refills | Status: AC

## 2023-08-14 NOTE — Patient Instructions (Addendum)
 Medication Instructions:  Your physician has recommended you make the following change in your medication:  Increase lisinopril to 40 mg daily Continue all other medications as prescribed  Labwork: Your physician recommends that you return for a FASTING lipid profile & BMET next week when you come for your nurse visit. Please do not eat or drink for at least 8 hours when you have this done. You may take your medications that morning with a sip of water. [facility]  Testing/Procedures: none  Follow-Up: Your physician recommends that you schedule a follow-up appointment in: 1 week for nurse visit Your physician recommends that you schedule a follow-up appointment in: 6 months with Dr. Diona Browner.   Any Other Special Instructions Will Be Listed Below (If Applicable).  If you need a refill on your cardiac medications before your next appointment, please call your pharmacy.

## 2023-08-21 ENCOUNTER — Other Ambulatory Visit (HOSPITAL_COMMUNITY)
Admission: RE | Admit: 2023-08-21 | Discharge: 2023-08-21 | Disposition: A | Discharge status: Home / Self Care | Source: Ambulatory Visit | Attending: Physician Assistant | Admitting: Physician Assistant

## 2023-08-21 DIAGNOSIS — I10 Essential (primary) hypertension: Secondary | ICD-10-CM | POA: Diagnosis present

## 2023-08-21 DIAGNOSIS — E782 Mixed hyperlipidemia: Secondary | ICD-10-CM | POA: Diagnosis present

## 2023-08-21 DIAGNOSIS — Z79899 Other long term (current) drug therapy: Secondary | ICD-10-CM | POA: Insufficient documentation

## 2023-08-21 DIAGNOSIS — I480 Paroxysmal atrial fibrillation: Secondary | ICD-10-CM | POA: Insufficient documentation

## 2023-08-21 LAB — BASIC METABOLIC PANEL
Anion gap: 12 (ref 5–15)
BUN: 13 mg/dL (ref 8–23)
CO2: 21 mmol/L — ABNORMAL LOW (ref 22–32)
Calcium: 9 mg/dL (ref 8.9–10.3)
Chloride: 106 mmol/L (ref 98–111)
Creatinine, Ser: 0.91 mg/dL (ref 0.61–1.24)
GFR, Estimated: >60 mL/min (ref >60)
Glucose, Bld: 91 mg/dL (ref 70–99)
Potassium: 3.7 mmol/L (ref 3.5–5.1)
Sodium: 139 mmol/L (ref 135–145)

## 2023-08-21 LAB — LIPID PANEL
Cholesterol: 185 mg/dL (ref 0–200)
HDL: 108 mg/dL (ref >40)
LDL Cholesterol: 36 mg/dL (ref 0–99)
Total CHOL/HDL Ratio: 1.7 ratio
Triglycerides: 207 mg/dL — ABNORMAL HIGH (ref <150)
VLDL: 41 mg/dL — ABNORMAL HIGH (ref 0–40)

## 2023-08-22 ENCOUNTER — Ambulatory Visit

## 2023-09-01 ENCOUNTER — Other Ambulatory Visit (HOSPITAL_COMMUNITY): Payer: Self-pay | Admitting: Physician Assistant

## 2023-09-15 ENCOUNTER — Other Ambulatory Visit (HOSPITAL_COMMUNITY): Payer: Self-pay | Admitting: Physician Assistant

## 2023-09-19 ENCOUNTER — Ambulatory Visit (HOSPITAL_COMMUNITY)
Admission: RE | Admit: 2023-09-19 | Discharge: 2023-09-19 | Disposition: A | Discharge status: Home / Self Care | Payer: PPO | Source: Ambulatory Visit | Attending: Physician Assistant | Admitting: Physician Assistant

## 2023-09-19 ENCOUNTER — Encounter (HOSPITAL_COMMUNITY): Payer: Self-pay | Admitting: Physician Assistant

## 2023-09-19 ENCOUNTER — Other Ambulatory Visit (HOSPITAL_COMMUNITY): Payer: Self-pay | Admitting: Physician Assistant

## 2023-09-19 VITALS — BP 150/90 | HR 65 | Ht 72.0 in | Wt 174.8 lb

## 2023-09-19 DIAGNOSIS — D6869 Other thrombophilia: Secondary | ICD-10-CM | POA: Insufficient documentation

## 2023-09-19 DIAGNOSIS — Z7901 Long term (current) use of anticoagulants: Secondary | ICD-10-CM | POA: Insufficient documentation

## 2023-09-19 DIAGNOSIS — I4892 Unspecified atrial flutter: Secondary | ICD-10-CM | POA: Insufficient documentation

## 2023-09-19 DIAGNOSIS — I10 Essential (primary) hypertension: Secondary | ICD-10-CM | POA: Diagnosis not present

## 2023-09-19 DIAGNOSIS — Z5181 Encounter for therapeutic drug level monitoring: Secondary | ICD-10-CM | POA: Insufficient documentation

## 2023-09-19 DIAGNOSIS — I480 Paroxysmal atrial fibrillation: Secondary | ICD-10-CM | POA: Insufficient documentation

## 2023-09-19 DIAGNOSIS — E782 Mixed hyperlipidemia: Secondary | ICD-10-CM | POA: Diagnosis not present

## 2023-09-19 DIAGNOSIS — Z79899 Other long term (current) drug therapy: Secondary | ICD-10-CM | POA: Diagnosis not present

## 2023-09-19 NOTE — Progress Notes (Signed)
 Primary Care Physician: Jason Coop, FNP Primary Cardiologist: Dr Diona Browner Primary Electrophysiologist: none Referring Physician: Dr Pedro Gibbs is a 68 y.o. male with a history of HTN, HLD, atrial flutter, and atrial fibrillation who presents for follow up in the [facility]. The patient was initially diagnosed with atrial fibrillation 07/25/20 after presenting to [facility] ED with tachypalpitations and chest tightness. Patient is on Eliquis for stroke prevention. He wore a cardiac monitor which showed atypical atrial flutter and atrial fibrillation with a 2% overall burden. He has been maintained on flecainide.   Patient returns for follow up for atrial fibrillation and flecainide monitoring. He reports that he has done very well since his last visit with no interim symptoms of afib. No bleeding issues on anticoagulation.   Today, he  denies symptoms of palpitations, chest pain, shortness of breath, orthopnea, PND, lower extremity edema, dizziness, presyncope, syncope, snoring, daytime somnolence, bleeding, or neurologic sequela. The patient is tolerating medications without difficulties and is otherwise without complaint today.    Atrial Fibrillation Risk Factors:  he does not have symptoms or diagnosis of sleep apnea. he does not have a history of rheumatic fever. he does have a history of alcohol use. The patient does have a history of early familial atrial fibrillation or other arrhythmias. Cousin/niece have afib.   Atrial Fibrillation Management history:  Previous antiarrhythmic drugs: flecainide  Previous cardioversions: none Previous ablations: none Anticoagulation history: Eliquis   Past Medical History:  Diagnosis Date   Anemia    Essential hypertension    Mixed hyperlipidemia    Paroxysmal atrial fibrillation (HCC)     Current Outpatient Medications  Medication Sig Dispense Refill   acetaminophen (TYLENOL)  500 MG tablet Take 1,000 mg by mouth every 8 (eight) hours as needed for moderate pain.     allopurinol (ZYLOPRIM) 300 MG tablet Take 300 mg by mouth daily as needed (gout).     apixaban (ELIQUIS) 5 MG TABS tablet Take 1 tablet by mouth twice daily 180 tablet 2   diphenhydramine-acetaminophen (TYLENOL PM) 25-500 MG TABS tablet Take 2 tablets by mouth at bedtime as needed (sleep).     erythromycin ophthalmic ointment Place a 1/2 inch ribbon of ointment into the lower eyelid. 3.5 g 0   flecainide (TAMBOCOR) 50 MG tablet TAKE (1) TABLET TWICE DAILY. 180 tablet 3   lisinopril (ZESTRIL) 40 MG tablet Take 1 tablet (40 mg total) by mouth daily. 90 tablet 1   metoprolol succinate (TOPROL-XL) 50 MG 24 hr tablet take 1 tablet twice daily immediately following a meal. 180 tablet 0   Omega-3 1000 MG CAPS Take 1,000 mg by mouth daily.     rosuvastatin (CRESTOR) 10 MG tablet Take 10 mg by mouth daily.     sildenafil (REVATIO) 20 MG tablet Take 40 mg by mouth daily as needed (ED).     tetrahydrozoline-zinc (VISINE-AC) 0.05-0.25 % ophthalmic solution Place 2 drops into both eyes 3 (three) times daily as needed.     No current facility-administered medications for this encounter.    ROS- All systems are reviewed and negative except as per the HPI above.  Physical Exam: Vitals:   09/19/23 1104  BP: (!) 150/90  Pulse: 65  Weight: 79.3 kg  Height: 6' (1.829 m)    GEN: Well nourished, well developed in no acute distress CARDIAC: Regular rate and rhythm, no murmurs, rubs, gallops RESPIRATORY:  Clear to auscultation without rales, wheezing or  rhonchi  ABDOMEN: Soft, non-tender, non-distended EXTREMITIES:  No edema; No deformity    Wt Readings from Last 3 Encounters:  09/19/23 79.3 kg  08/14/23 80.9 kg  06/19/23 79 kg    EKG today demonstrates  SR Vent. rate 65 BPM PR interval 168 ms QRS duration 88 ms QT/QTcB 388/403 ms   Echo 08/28/20 demonstrated  1. Left ventricular ejection fraction, by  estimation, is 60 to 65%. The  left ventricle has normal function. The left ventricle has no regional  wall motion abnormalities. Left ventricular diastolic parameters were  normal.   2. Right ventricular systolic function is normal. The right ventricular  size is normal.   3. The mitral valve is normal in structure. Mild mitral valve  regurgitation.   4. The aortic valve is tricuspid. Aortic valve regurgitation is not  visualized. Mild aortic valve sclerosis is present, with no evidence of  aortic valve stenosis.   5. The inferior vena cava is normal in size with greater than 50%  respiratory variability, suggesting right atrial pressure of 3 mmHg.   Epic records are reviewed at length today  CHA2DS2-VASc Score = 2  The patient's score is based upon: CHF History: 0 HTN History: 1 Diabetes History: 0 Stroke History: 0 Vascular Disease History: 0 Age Score: 1 Gender Score: 0       ASSESSMENT AND PLAN: Paroxysmal Atrial Fibrillation/atrial flutter The patient's CHA2DS2-VASc score is 2, indicating a 2.2% annual risk of stroke.   Patient appears to be maintaining SR Continue flecainide 50 mg BID Continue Toprol 50 mg daily Continue Eliquis 5 mg BID  Secondary Hypercoagulable State (ICD10:  D68.69) The patient is at significant risk for stroke/thromboembolism based upon his CHA2DS2-VASc Score of 2.  Continue Apixaban (Eliquis).   High Risk Medication Monitoring (ICD 10: Z79.899) Intervals on ECG acceptable for flecainide monitoring.      HTN Mildly elevated today, lisinopril recently increased. Patient to keep BP log for review with PCP at their visit in June.     Follow up in the AF clinic in one year. Dr Londa Rival per recall.     Myrtha Ates PA-C [facility] [address] [place], [place] [address] [phone] 09/19/2023 11:30 AM

## 2023-11-16 ENCOUNTER — Other Ambulatory Visit (HOSPITAL_COMMUNITY): Payer: Self-pay | Admitting: Internal Medicine

## 2023-11-16 DIAGNOSIS — R109 Unspecified abdominal pain: Secondary | ICD-10-CM

## 2023-11-24 ENCOUNTER — Ambulatory Visit (HOSPITAL_COMMUNITY)
Admission: RE | Admit: 2023-11-24 | Discharge: 2023-11-24 | Disposition: A | Discharge status: Home / Self Care | Source: Ambulatory Visit | Attending: Internal Medicine | Admitting: Internal Medicine

## 2023-11-24 DIAGNOSIS — R109 Unspecified abdominal pain: Secondary | ICD-10-CM | POA: Diagnosis present

## 2023-12-13 ENCOUNTER — Other Ambulatory Visit (HOSPITAL_COMMUNITY): Payer: Self-pay | Admitting: Physician Assistant

## 2024-05-31 ENCOUNTER — Other Ambulatory Visit (HOSPITAL_COMMUNITY): Payer: Self-pay | Admitting: Physician Assistant

## 2024-07-01 ENCOUNTER — Other Ambulatory Visit (HOSPITAL_COMMUNITY): Payer: Self-pay | Admitting: Physician Assistant
